# Patient Record
Sex: Male | Born: 1960 | Race: White | Hispanic: No | Marital: Single | Smoking: Former smoker
Health system: Southern US, Community
[De-identification: ages and names within clinical notes are randomized; demographics above are authoritative.]

## PROBLEM LIST (undated history)

## (undated) ENCOUNTER — Emergency Department (HOSPITAL_COMMUNITY): Payer: Self-pay

## (undated) DIAGNOSIS — K219 Gastro-esophageal reflux disease without esophagitis: Secondary | ICD-10-CM

## (undated) DIAGNOSIS — I2510 Atherosclerotic heart disease of native coronary artery without angina pectoris: Secondary | ICD-10-CM

## (undated) HISTORY — PX: CORONARY ANGIOPLASTY WITH STENT PLACEMENT: SHX49

## (undated) HISTORY — PX: HERNIA REPAIR: SHX51

## (undated) HISTORY — PX: ANEURYSM COILING: SHX5349

---

## 2005-12-18 ENCOUNTER — Emergency Department (HOSPITAL_COMMUNITY): Admission: EM | Admit: 2005-12-18 | Discharge: 2005-12-19 | Payer: Self-pay | Admitting: Emergency Medicine

## 2006-03-20 ENCOUNTER — Emergency Department (HOSPITAL_COMMUNITY): Admission: EM | Admit: 2006-03-20 | Discharge: 2006-03-20 | Payer: Self-pay | Admitting: Emergency Medicine

## 2010-06-27 ENCOUNTER — Other Ambulatory Visit: Payer: Self-pay | Admitting: Family Medicine

## 2010-06-27 ENCOUNTER — Ambulatory Visit
Admission: RE | Admit: 2010-06-27 | Discharge: 2010-06-27 | Disposition: A | Discharge status: Home / Self Care | Payer: Commercial Indemnity | Source: Ambulatory Visit | Attending: Family Medicine | Admitting: Family Medicine

## 2010-06-27 DIAGNOSIS — R52 Pain, unspecified: Secondary | ICD-10-CM

## 2010-07-06 ENCOUNTER — Ambulatory Visit: Payer: Commercial Indemnity | Attending: Family Medicine

## 2010-07-06 DIAGNOSIS — M6281 Muscle weakness (generalized): Secondary | ICD-10-CM | POA: Insufficient documentation

## 2010-07-06 DIAGNOSIS — M25569 Pain in unspecified knee: Secondary | ICD-10-CM | POA: Insufficient documentation

## 2010-07-06 DIAGNOSIS — M25669 Stiffness of unspecified knee, not elsewhere classified: Secondary | ICD-10-CM | POA: Insufficient documentation

## 2010-07-06 DIAGNOSIS — IMO0001 Reserved for inherently not codable concepts without codable children: Secondary | ICD-10-CM | POA: Insufficient documentation

## 2010-07-06 DIAGNOSIS — R5381 Other malaise: Secondary | ICD-10-CM | POA: Insufficient documentation

## 2010-07-18 ENCOUNTER — Encounter: Payer: Commercial Indemnity | Admitting: Physical Therapy

## 2011-11-21 ENCOUNTER — Emergency Department (HOSPITAL_COMMUNITY): Payer: BC Managed Care – PPO

## 2011-11-21 ENCOUNTER — Encounter (HOSPITAL_COMMUNITY): Payer: Self-pay | Admitting: Emergency Medicine

## 2011-11-21 ENCOUNTER — Other Ambulatory Visit: Payer: Self-pay

## 2011-11-21 ENCOUNTER — Inpatient Hospital Stay (HOSPITAL_COMMUNITY)
Admission: RE | Admit: 2011-11-21 | Discharge: 2011-11-23 | DRG: 854 | Disposition: A | Discharge status: Home / Self Care | Payer: BC Managed Care – PPO | Attending: Cardiology | Admitting: Cardiology

## 2011-11-21 DIAGNOSIS — I200 Unstable angina: Secondary | ICD-10-CM

## 2011-11-21 DIAGNOSIS — I2510 Atherosclerotic heart disease of native coronary artery without angina pectoris: Principal | ICD-10-CM | POA: Diagnosis present

## 2011-11-21 DIAGNOSIS — Z955 Presence of coronary angioplasty implant and graft: Secondary | ICD-10-CM

## 2011-11-21 DIAGNOSIS — E785 Hyperlipidemia, unspecified: Secondary | ICD-10-CM | POA: Diagnosis present

## 2011-11-21 HISTORY — DX: Gastro-esophageal reflux disease without esophagitis: K21.9

## 2011-11-21 LAB — POCT I-STAT TROPONIN I
Troponin i, poc: 0.01 ng/mL (ref 0.00–0.08)
Troponin i, poc: 0.02 ng/mL (ref 0.00–0.08)

## 2011-11-21 LAB — CBC WITH DIFFERENTIAL/PLATELET
Lymphocytes Relative: 28 % (ref 12–46)
Lymphs Abs: 2.3 10*3/uL (ref 0.7–4.0)
MCV: 85.2 fL (ref 78.0–100.0)
Neutrophils Relative %: 60 % (ref 43–77)
Platelets: 258 10*3/uL (ref 150–400)
RBC: 5.27 MIL/uL (ref 4.22–5.81)
WBC: 8.3 10*3/uL (ref 4.0–10.5)

## 2011-11-21 LAB — COMPREHENSIVE METABOLIC PANEL
ALT: 36 U/L (ref 0–53)
Alkaline Phosphatase: 60 U/L (ref 39–117)
CO2: 27 mEq/L (ref 19–32)
GFR calc Af Amer: >90 mL/min (ref >90)
GFR calc non Af Amer: >90 mL/min (ref >90)
Glucose, Bld: 97 mg/dL (ref 70–99)
Potassium: 4.3 mEq/L (ref 3.5–5.1)
Sodium: 135 mEq/L (ref 135–145)

## 2011-11-21 NOTE — ED Provider Notes (Signed)
5:26 PM Patient to be moved to CDU for chest pain protocol.  This is a shared visit with Dr Judd Lien.  Patient reported to have exertional chest pain and shortness of breath.  BMI 32, plan is for coronary CT.   6:59 PM Patient is in CDU.  No complaints or needs at this time.  Pt has had exertional chest pain for the past 10 days.  Chest pain free currently.  Discussed plan with patient and his wife.  Patient is A&Ox4, NAD, RRR, no m/r/g, lungs CTAB, abd soft, NT, extremities without edema, distal pulses intact.  Plan is for coronary CT tomorrow morning and continued monitoring overnight.   11:20 PM Patient reports he continues to feel well, resting comfortably.  Patient to remain on chest pain protocol overnight.  Coronary CT in the morning.  Patient was initially seen by Dr Judd Lien on Pod A.    11:51 PM Pt signed out to Dr Norlene Campbell who will assume care of patient overnight.    Results for orders placed during the hospital encounter of 11/21/11  CBC WITH DIFFERENTIAL      Component Value Range   WBC 8.3  4.0 - 10.5 K/uL   RBC 5.27  4.22 - 5.81 MIL/uL   Hemoglobin 16.1  13.0 - 17.0 g/dL   HCT 21.3  08.6 - 57.8 %   MCV 85.2  78.0 - 100.0 fL   MCH 30.6  26.0 - 34.0 pg   MCHC 35.9  30.0 - 36.0 g/dL   RDW 46.9  62.9 - 52.8 %   Platelets 258  150 - 400 K/uL   Neutrophils Relative 60  43 - 77 %   Neutro Abs 4.9  1.7 - 7.7 K/uL   Lymphocytes Relative 28  12 - 46 %   Lymphs Abs 2.3  0.7 - 4.0 K/uL   Monocytes Relative 8  3 - 12 %   Monocytes Absolute 0.7  0.1 - 1.0 K/uL   Eosinophils Relative 4  0 - 5 %   Eosinophils Absolute 0.3  0.0 - 0.7 K/uL   Basophils Relative 1  0 - 1 %   Basophils Absolute 0.1  0.0 - 0.1 K/uL  COMPREHENSIVE METABOLIC PANEL      Component Value Range   Sodium 135  135 - 145 mEq/L   Potassium 4.3  3.5 - 5.1 mEq/L   Chloride 99  96 - 112 mEq/L   CO2 27  19 - 32 mEq/L   Glucose, Bld 97  70 - 99 mg/dL   BUN 11  6 - 23 mg/dL   Creatinine, Ser 4.13  0.50 - 1.35 mg/dL   Calcium  9.4  8.4 - 24.4 mg/dL   Total Protein 7.8  6.0 - 8.3 g/dL   Albumin 4.1  3.5 - 5.2 g/dL   AST 25  0 - 37 U/L   ALT 36  0 - 53 U/L   Alkaline Phosphatase 60  39 - 117 U/L   Total Bilirubin 0.7  0.3 - 1.2 mg/dL   GFR calc non Af Amer >90  >90 mL/min   GFR calc Af Amer >90  >90 mL/min  POCT I-STAT TROPONIN I      Component Value Range   Troponin i, poc 0.00  0.00 - 0.08 ng/mL   Comment 3           POCT I-STAT TROPONIN I      Component Value Range   Troponin i, poc 0.01  0.00 -  0.08 ng/mL   Comment 3           POCT I-STAT TROPONIN I      Component Value Range   Troponin i, poc 0.01  0.00 - 0.08 ng/mL   Comment 3            Dg Chest 2 View  11/21/2011  *RADIOLOGY REPORT*  Clinical Data: Chest pain and shortness of breath.  CHEST - 2 VIEW  Comparison: None  Findings: The cardiac silhouette, mediastinal and hilar contours are normal.  The lungs are clear.  No pleural effusion.  The bony thorax is intact.  IMPRESSION: Normal chest x-ray.   Original Report Authenticated By: P. Loralie Champagne, M.D.       [place], [place] 11/21/11 2351

## 2011-11-21 NOTE — ED Notes (Signed)
Cp x 8-9 days ago  No n/v some sob left arm and neck pain also he states

## 2011-11-21 NOTE — ED Provider Notes (Signed)
History     CSN: [id number]  Arrival date & time 11/21/11  1323   First MD Initiated Contact with Patient 11/21/11 1628      Chief Complaint  Patient presents with  . Chest Pain    (Consider location/radiation/quality/duration/timing/severity/associated sxs/prior treatment) Patient is a 51 y.o. male presenting with chest pain. The history is provided by the patient.  Chest Pain Episode onset: 8-9 days ago. Chest pain occurs intermittently. The chest pain is worsening. The pain is associated with exertion. At its most intense, the pain is at 7/10. The pain is currently at 0/10. The severity of the pain is moderate. The quality of the pain is described as tightness. The pain does not radiate. Chest pain is worsened by exertion. Primary symptoms include shortness of breath and nausea. Pertinent negatives for primary symptoms include no fever and no abdominal pain. He tried nothing for the symptoms. Risk factors include no known risk factors.  Pertinent negatives for family medical history include: no CAD in family.  Procedure history is negative for cardiac catheterization, echocardiogram, stress echo and exercise treadmill test.     Past Medical History  Diagnosis Date  . Acid reflux     Past Surgical History  Procedure Date  . Hernia repair     No family history on file.  History  Substance Use Topics  . Smoking status: Never Smoker   . Smokeless tobacco: Not on file  . Alcohol Use: No      Review of Systems  Constitutional: Negative for fever.  Respiratory: Positive for shortness of breath.   Cardiovascular: Positive for chest pain.  Gastrointestinal: Positive for nausea. Negative for abdominal pain.  All other systems reviewed and are negative.    Allergies  Amoxapine and related  Home Medications   Current Outpatient Rx  Name Route Sig Dispense Refill  . ASPIRIN EC 81 MG PO TBEC Oral Take 81 mg by mouth daily.    Marland Kitchen ACID REDUCER PO Oral Take 1 tablet by  mouth daily as needed. For acid reflux      BP 165/93  Pulse 56  Temp 97.9 F (36.6 C)  Resp 16  SpO2 98%  Physical Exam  Nursing note and vitals reviewed. Constitutional: He is oriented to person, place, and time. He appears well-developed and well-nourished. No distress.  HENT:  Head: Normocephalic and atraumatic.  Mouth/Throat: Oropharynx is clear and moist.  Neck: Normal range of motion. Neck supple.  Cardiovascular: Normal rate and regular rhythm.   No murmur heard. Pulmonary/Chest: Effort normal and breath sounds normal. No respiratory distress. He has no wheezes.  Abdominal: Soft. Bowel sounds are normal. He exhibits no distension. There is no tenderness.  Musculoskeletal: Normal range of motion. He exhibits no edema.  Neurological: He is alert and oriented to person, place, and time.  Skin: Skin is warm and dry. He is not diaphoretic.    ED Course  Procedures (including critical care time)   Labs Reviewed  CBC WITH DIFFERENTIAL  COMPREHENSIVE METABOLIC PANEL  POCT I-STAT TROPONIN I   Dg Chest 2 View  11/21/2011  *RADIOLOGY REPORT*  Clinical Data: Chest pain and shortness of breath.  CHEST - 2 VIEW  Comparison: None  Findings: The cardiac silhouette, mediastinal and hilar contours are normal.  The lungs are clear.  No pleural effusion.  The bony thorax is intact.  IMPRESSION: Normal chest x-ray.   Original Report Authenticated By: P. Loralie Champagne, M.D.      No diagnosis found.  Date: 11/21/2011  Rate: 54  Rhythm: sinus bradycardia  QRS Axis: normal  Intervals: normal  ST/T Wave abnormalities: normal  Conduction Disutrbances:none  Narrative Interpretation:   Old EKG Reviewed: none available    MDM  The patient presents with exertional chest pain and shortness of breath, but negative troponin and ekg.  He will be placed in the CDU overnight and undergo stress testing in the am.          Geoffery Lyons, MD 11/21/11 1654

## 2011-11-21 NOTE — ED Notes (Signed)
Just came from dr office eagle they gave  Him [facility]

## 2011-11-22 ENCOUNTER — Observation Stay (HOSPITAL_COMMUNITY): Payer: BC Managed Care – PPO

## 2011-11-22 ENCOUNTER — Encounter (HOSPITAL_COMMUNITY): Payer: Self-pay | Admitting: Radiology

## 2011-11-22 ENCOUNTER — Encounter (HOSPITAL_COMMUNITY)
Admission: RE | Disposition: A | Discharge status: Home / Self Care | Payer: Self-pay | Source: Home / Self Care | Attending: Cardiology

## 2011-11-22 DIAGNOSIS — I200 Unstable angina: Secondary | ICD-10-CM

## 2011-11-22 HISTORY — PX: LEFT HEART CATHETERIZATION WITH CORONARY ANGIOGRAM: SHX5451

## 2011-11-22 HISTORY — DX: Unstable angina: I20.0

## 2011-11-22 LAB — LIPID PANEL
HDL: 33 mg/dL — ABNORMAL LOW (ref >39)
LDL Cholesterol: 158 mg/dL — ABNORMAL HIGH (ref 0–99)
Total CHOL/HDL Ratio: 6.8 RATIO
Triglycerides: 173 mg/dL — ABNORMAL HIGH (ref <150)
VLDL: 35 mg/dL (ref 0–40)

## 2011-11-22 LAB — CK TOTAL AND CKMB (NOT AT ARMC): Total CK: 125 U/L (ref 7–232)

## 2011-11-22 LAB — TROPONIN I: Troponin I: <0.3 ng/mL (ref <0.30)

## 2011-11-22 SURGERY — LEFT HEART CATHETERIZATION WITH CORONARY ANGIOGRAM
Anesthesia: LOCAL

## 2011-11-22 MED ORDER — SODIUM CHLORIDE 0.9 % IV SOLN: INTRAVENOUS | Status: DC

## 2011-11-22 MED ORDER — NITROGLYCERIN 0.4 MG SL SUBL: 0.4000 mg | SUBLINGUAL_TABLET | SUBLINGUAL | Status: DC | PRN

## 2011-11-22 MED ORDER — ASPIRIN EC 81 MG PO TBEC: 81.0000 mg | DELAYED_RELEASE_TABLET | Freq: Every day | ORAL | Status: DC

## 2011-11-22 MED ORDER — ACETAMINOPHEN 325 MG PO TABS
650.0000 mg | ORAL_TABLET | ORAL | Status: DC | PRN
  Administered 2011-11-22 – 2011-11-23 (×2): 650 mg via ORAL
  Filled 2011-11-22 (×2): qty 2

## 2011-11-22 MED ORDER — SODIUM CHLORIDE 0.9 % IJ SOLN: 3.0000 mL | INTRAMUSCULAR | Status: DC | PRN

## 2011-11-22 MED ORDER — MIDAZOLAM HCL 2 MG/2ML IJ SOLN
INTRAMUSCULAR | Status: AC
  Filled 2011-11-22: qty 2

## 2011-11-22 MED ORDER — ASPIRIN 81 MG PO CHEW
81.0000 mg | CHEWABLE_TABLET | Freq: Every day | ORAL | Status: DC
Start: 2011-11-22 — End: 2011-11-22

## 2011-11-22 MED ORDER — FAMOTIDINE IN NACL 20-0.9 MG/50ML-% IV SOLN
INTRAVENOUS | Status: AC
  Filled 2011-11-22: qty 50

## 2011-11-22 MED ORDER — LIDOCAINE HCL (PF) 1 % IJ SOLN
INTRAMUSCULAR | Status: AC
  Filled 2011-11-22: qty 30

## 2011-11-22 MED ORDER — VERAPAMIL HCL 2.5 MG/ML IV SOLN
INTRAVENOUS | Status: AC
  Filled 2011-11-22: qty 2

## 2011-11-22 MED ORDER — ASPIRIN 300 MG RE SUPP
300.0000 mg | RECTAL | Status: DC
Start: 2011-11-22 — End: 2011-11-22

## 2011-11-22 MED ORDER — SODIUM CHLORIDE 0.9 % IV SOLN
1.0000 mL/kg/h | INTRAVENOUS | Status: AC
  Administered 2011-11-22: 1 mL/kg/h via INTRAVENOUS

## 2011-11-22 MED ORDER — CARVEDILOL 3.125 MG PO TABS
3.1250 mg | ORAL_TABLET | Freq: Two times a day (BID) | ORAL | Status: DC
  Administered 2011-11-23: 3.125 mg via ORAL
  Filled 2011-11-22 (×2): qty 1

## 2011-11-22 MED ORDER — ACETAMINOPHEN 325 MG PO TABS: 650.0000 mg | ORAL_TABLET | ORAL | Status: DC | PRN

## 2011-11-22 MED ORDER — ASPIRIN 81 MG PO CHEW: 324.0000 mg | CHEWABLE_TABLET | ORAL | Status: DC

## 2011-11-22 MED ORDER — CIMETIDINE 200 MG PO TABS: 300.0000 mg | ORAL_TABLET | Freq: Two times a day (BID) | ORAL | Status: DC

## 2011-11-22 MED ORDER — ONDANSETRON HCL 4 MG/2ML IJ SOLN: 4.0000 mg | Freq: Four times a day (QID) | INTRAMUSCULAR | Status: DC | PRN

## 2011-11-22 MED ORDER — NITROGLYCERIN 0.4 MG SL SUBL
SUBLINGUAL_TABLET | SUBLINGUAL | Status: AC
  Administered 2011-11-22: 0.4 mg via SUBLINGUAL
  Filled 2011-11-22: qty 25

## 2011-11-22 MED ORDER — TICAGRELOR 90 MG PO TABS
ORAL_TABLET | ORAL | Status: AC
  Filled 2011-11-22: qty 2

## 2011-11-22 MED ORDER — NITROGLYCERIN 0.4 MG SL SUBL
0.4000 mg | SUBLINGUAL_TABLET | Freq: Once | SUBLINGUAL | Status: AC
  Administered 2011-11-22: 0.4 mg via SUBLINGUAL

## 2011-11-22 MED ORDER — ATORVASTATIN CALCIUM 80 MG PO TABS
80.0000 mg | ORAL_TABLET | Freq: Every day | ORAL | Status: DC
  Filled 2011-11-22 (×2): qty 1

## 2011-11-22 MED ORDER — ACETAMINOPHEN 325 MG PO TABS
975.0000 mg | ORAL_TABLET | Freq: Once | ORAL | Status: AC
  Administered 2011-11-22: 975 mg via ORAL
  Filled 2011-11-22: qty 3

## 2011-11-22 MED ORDER — DIAZEPAM 5 MG PO TABS
10.0000 mg | ORAL_TABLET | Freq: Once | ORAL | Status: DC
  Filled 2011-11-22: qty 2

## 2011-11-22 MED ORDER — ATORVASTATIN CALCIUM 40 MG PO TABS
40.0000 mg | ORAL_TABLET | Freq: Every day | ORAL | Status: DC
  Administered 2011-11-22: 40 mg via ORAL
  Filled 2011-11-22 (×2): qty 1

## 2011-11-22 MED ORDER — NITROGLYCERIN 0.2 MG/ML ON CALL CATH LAB
INTRAVENOUS | Status: AC
  Filled 2011-11-22: qty 1

## 2011-11-22 MED ORDER — SODIUM CHLORIDE 0.9 % IJ SOLN: 3.0000 mL | Freq: Two times a day (BID) | INTRAMUSCULAR | Status: DC

## 2011-11-22 MED ORDER — METOPROLOL TARTRATE 1 MG/ML IV SOLN
INTRAVENOUS | Status: AC
  Filled 2011-11-22: qty 15

## 2011-11-22 MED ORDER — METOPROLOL TARTRATE 25 MG PO TABS
50.0000 mg | ORAL_TABLET | Freq: Once | ORAL | Status: AC
  Administered 2011-11-22: 50 mg via ORAL
  Filled 2011-11-22: qty 2

## 2011-11-22 MED ORDER — HEPARIN (PORCINE) IN NACL 2-0.9 UNIT/ML-% IJ SOLN
INTRAMUSCULAR | Status: AC
  Filled 2011-11-22: qty 1000

## 2011-11-22 MED ORDER — GI COCKTAIL ~~LOC~~
30.0000 mL | Freq: Three times a day (TID) | ORAL | Status: DC | PRN
  Filled 2011-11-22: qty 30

## 2011-11-22 MED ORDER — SODIUM CHLORIDE 0.9 % IV SOLN: 250.0000 mL | INTRAVENOUS | Status: DC | PRN

## 2011-11-22 MED ORDER — PANTOPRAZOLE SODIUM 40 MG PO TBEC
80.0000 mg | DELAYED_RELEASE_TABLET | Freq: Every day | ORAL | Status: DC
  Administered 2011-11-22: 22:00:00 80 mg via ORAL
  Filled 2011-11-22 (×2): qty 2

## 2011-11-22 MED ORDER — CARVEDILOL 3.125 MG PO TABS: 3.1250 mg | ORAL_TABLET | Freq: Two times a day (BID) | ORAL | Status: DC

## 2011-11-22 MED ORDER — ASPIRIN 81 MG PO CHEW
324.0000 mg | CHEWABLE_TABLET | Freq: Once | ORAL | Status: AC
  Administered 2011-11-22: 324 mg via ORAL
  Filled 2011-11-22: qty 4

## 2011-11-22 MED ORDER — BIVALIRUDIN 250 MG IV SOLR
INTRAVENOUS | Status: AC
  Filled 2011-11-22: qty 250

## 2011-11-22 MED ORDER — FAMOTIDINE 20 MG PO TABS
20.0000 mg | ORAL_TABLET | Freq: Two times a day (BID) | ORAL | Status: DC
  Administered 2011-11-22: 20 mg via ORAL
  Filled 2011-11-22 (×3): qty 1

## 2011-11-22 MED ORDER — TICAGRELOR 90 MG PO TABS
90.0000 mg | ORAL_TABLET | Freq: Two times a day (BID) | ORAL | Status: DC
  Administered 2011-11-23: 90 mg via ORAL
  Filled 2011-11-22 (×3): qty 1

## 2011-11-22 MED ORDER — IOHEXOL 350 MG/ML SOLN
80.0000 mL | Freq: Once | INTRAVENOUS | Status: AC | PRN
  Administered 2011-11-22: 80 mL via INTRAVENOUS

## 2011-11-22 NOTE — Progress Notes (Signed)
TR BAND REMOVAL  LOCATION:    right radial  DEFLATED PER PROTOCOL:    yes  TIME BAND OFF / DRESSING APPLIED:    22:45   SITE UPON ARRIVAL:    Level 0  SITE AFTER BAND REMOVAL:    Level 0  REVERSE ALLEN'S TEST:     positive  CIRCULATION SENSATION AND MOVEMENT:    Within Normal Limits   yes  COMMENTS:    

## 2011-11-22 NOTE — ED Notes (Signed)
CONSENT SIGNED, AWAITING CALL FROM CATH LAB.

## 2011-11-22 NOTE — H&P (Signed)
Adam Calderon is an 51 y.o. male.   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 She describes this as pressure like sensation in the middle of the chest without any radiation. Chest pain is described as pressure-like sensation in the middle of the chest and mostly comes with exertional activity. Each episode lasted a few minutes, however she has had at least about 2-3 episodes a day. Today his chest discomfort was much more severe and lasted for about 45 minutes. It Coming back. Due to this he presented to his PCP office, he was advised to the emergency department. In the ED he did rule out for myocardial infarction, however he still continued to have chest discomfort. A CT angiogram of the chest revealed a significant coronary calcification and also soft plaque involving the proximal LAD and right coronary artery. Patient states that he's had at least about 3 episodes of chest discomfort while in the emergency room. They lasted for about 5 minutes and spontaneously subsided. He otherwise feels comfortable. No other associated symptomatology including he denies any dyspnea, palpitations, dizziness or syncope. Family members at the bedside including his wife.  Past Medical History  Diagnosis Date  . Acid reflux     Past Surgical History  Procedure Date  . Hernia repair     No family history on file.  Paternal grandfather with MI and died at age 49 years. Father died young with drug overdose.  Social History:  reports that he has never smoked. He does not have any smokeless tobacco history on file. He reports that he does not drink alcohol. His drug history not on file.  Allergies:  Allergies  Allergen Reactions  . Amoxapine And Related Itching  and Rash     (Not in a hospital admission)  Results for orders placed during the hospital encounter of 11/21/11 (from the past 48 hour(s))  CBC WITH DIFFERENTIAL     Status: Normal   Collection Time   11/21/11  1:24 PM      Component Value Range Comment   WBC 8.3  4.0 - 10.5 K/uL    RBC 5.27  4.22 - 5.81 MIL/uL    Hemoglobin 16.1  13.0 - 17.0 g/dL    HCT 21.3  08.6 - 57.8 %    MCV 85.2  78.0 - 100.0 fL    MCH 30.6  26.0 - 34.0 pg    MCHC 35.9  30.0 - 36.0 g/dL    RDW 46.9  62.9 - 52.8 %    Platelets 258  150 - 400 K/uL    Neutrophils Relative 60  43 - 77 %    Neutro Abs 4.9  1.7 - 7.7 K/uL    Lymphocytes Relative 28  12 - 46 %    Lymphs Abs 2.3  0.7 - 4.0 K/uL    Monocytes Relative 8  3 - 12 %    Monocytes Absolute 0.7  0.1 - 1.0 K/uL    Eosinophils Relative 4  0 - 5 %    Eosinophils Absolute 0.3  0.0 - 0.7 K/uL    Basophils Relative 1  0 - 1 %    Basophils Absolute 0.1  0.0 - 0.1 K/uL   COMPREHENSIVE METABOLIC PANEL  Status: Normal   Collection Time   11/21/11  1:24 PM      Component Value Range Comment   Sodium 135  135 - 145 mEq/L    Potassium 4.3  3.5 - 5.1 mEq/L    Chloride 99  96 - 112 mEq/L    CO2 27  19 - 32 mEq/L    Glucose, Bld 97  70 - 99 mg/dL    BUN 11  6 - 23 mg/dL    Creatinine, Ser 1.61  0.50 - 1.35 mg/dL    Calcium 9.4  8.4 - 09.6 mg/dL    Total Protein 7.8  6.0 - 8.3 g/dL    Albumin 4.1  3.5 - 5.2 g/dL    AST 25  0 - 37 U/L    ALT 36  0 - 53 U/L    Alkaline Phosphatase 60  39 - 117 U/L    Total Bilirubin 0.7  0.3 - 1.2 mg/dL    GFR calc non Af Amer >90  >90 mL/min    GFR calc Af Amer >90  >90 mL/min   POCT I-STAT TROPONIN I     Status: Normal   Collection Time   11/21/11  1:38 PM      Component Value Range Comment   Troponin i, poc 0.00  0.00 - 0.08 ng/mL    Comment 3            POCT I-STAT TROPONIN I     Status: Normal   Collection Time   11/21/11  6:13 PM      Component Value Range Comment   Troponin i, poc 0.01  0.00 - 0.08 ng/mL     Comment 3            POCT I-STAT TROPONIN I     Status: Normal   Collection Time   11/21/11  8:54 PM      Component Value Range Comment   Troponin i, poc 0.01  0.00 - 0.08 ng/mL    Comment 3            POCT I-STAT TROPONIN I     Status: Normal   Collection Time   11/21/11 11:38 PM      Component Value Range Comment   Troponin i, poc 0.02  0.00 - 0.08 ng/mL    Comment 3            POCT I-STAT TROPONIN I     Status: Normal   Collection Time   11/22/11  2:27 PM      Component Value Range Comment   Troponin i, poc 0.00  0.00 - 0.08 ng/mL    Comment 3             CT angiogram of the chest/Cardiac CT:  1.  Positive for obstructive coronary artery disease.  The patient's total coronary artery calcium score is 250, which is 97 percentile for patient's matched age and gender. 2.  Greater than 75% stenosis within the mid LAD and proximal to mid RCA related to a large amount of low attenuation plaque with positive remodeling. 3.  Right coronary artery dominance.   Labs:   Lab Results  Component Value Date   WBC 8.3 11/21/2011   HGB 16.1 11/21/2011   HCT 44.9 11/21/2011   MCV 85.2 11/21/2011   PLT 258 11/21/2011    Lab 11/21/11 1324  NA 135  K 4.3  CL 99  CO2 27  BUN 11  CREATININE 0.87  CALCIUM 9.4  PROT 7.8  BILITOT 0.7  ALKPHOS 60  ALT 36  AST 25  GLUCOSE 97   No results found for this basename: CKTOTAL, CKMB, CKMBINDEX, TROPONINI    Lipid Panel   Review of Systems - he denies any bowel or bladder disturbances, denies any neurological weaknesses, is not a diabetic, denies any symptoms to suggest TIA or claudication. No syncope. No hemoptysis. Other systems negative.  Blood pressure 141/88, pulse 48, temperature 98.1 F (36.7 C), temperature source Oral, resp. rate 21, height 5\' 10"  (1.778 m), weight 101.152 kg (223 lb), SpO2 97.00%. General appearance: alert, cooperative, appears older than stated age and mildly obese Eyes: conjunctivae/corneas clear. PERRL, EOM's  intact. Fundi benign. Neck: no adenopathy, no carotid bruit, no JVD, supple, symmetrical, trachea midline and thyroid not enlarged, symmetric, no tenderness/mass/nodules Neck: JVP - normal, carotids 2+= without bruits Resp: clear to auscultation bilaterally Chest wall: no tenderness Cardio: regular rate and rhythm, S1, S2 normal, no murmur, click, rub or gallop GI: soft, non-tender; bowel sounds normal; no masses,  no organomegaly Extremities: extremities normal, atraumatic, no cyanosis or edema Pulses: 2+ and symmetric Skin: Skin color, texture, turgor normal. No rashes or lesions Neurologic: Alert and oriented X 3, normal strength and tone. Normal symmetric reflexes. Normal coordination and gait  EKG: Normal sinus rhythm, normal intervals, no evidence of ischemia. CT scan, CT angiogram of the chest results were reviewed.  Assessment/Plan 1. Chest pain suggestive of unstable angina pectoris. Patient continues to have chest discomfort which he spontaneously relieved while in the emergency room. Presently appears comfortable and is physical examination is unremarkable. CT angiogram findings are pretty remarkable and suggestive of Mid LAD and Proximal right coronary artery stenosis.  Recommendation: Patient's prior history of tobacco use, abnormal CT angiogram with ongoing chest discomfort places him at a high risk for coronary event. His symptoms are very typical for unstable angina pectoris. I will admit the patient and proceed with cardiac catheterization this afternoon. I have explained to the patient regarding the risks, benefits, alternatives to cardiac catheterization. Medical therapy was also discussed with the patient. She understands to less than 1% risk of death stroke, MI, and need for urgent CABG, bleeding, infection, renal failure but not limited to these. Patient is willing.  Pamella Pert, MD 11/22/2011, 4:14 PM

## 2011-11-22 NOTE — ED Notes (Signed)
PT AND FAMILY UPDATED ON APPROX TIME TO CATH LAB

## 2011-11-22 NOTE — CV Procedure (Signed)
Procedure performed:  Left heart catheterization including hemodynamic monitoring of the left ventricle, LV gram. Selective right and left coronary arteriography. PTCA and stenting  of the Mid LAD with 3.5 x 23 mm Xience xpedition stent and proximal RCA with implantation of a 4.0 x 23 mm Xience epedition stent. The stenosis in both vessels was reduced from 95% to 0%.  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 Patient has  had CT angiogram of the heart as a non invasive testing which was abnormal.  Hence is brought to the cardiac catheterization lab to evaluate  coronary anatomy for definitive diagnosis of CAD.  Hemodynamic data: Left ventricular pressure was 114/1 with LVEDP of 8 mm mercury. Aortic pressure was 116/76 with a mean of 94 mm mercury. There was no pressure gradient across the aortic valve.   Left ventricle: Performed in the RAO projection revealed LVEF of  60%. There was no significant MR. No wall motion abnormality.  Right coronary artery: The vessel is smooth, Large and Dominant. Proximal segment showed a 95% stenosis.  Left main coronary artery is large and normal.  Circumflex coronary artery: A large vessel giving origin to a small obtuse marginal 1. Mid segment shows a 50-60% mid stenosis and a small obtuse marginal 1 shows a 70% stenosis.   LAD:  LAD gives origin to small to moderate diagonals.Marland Kitchen  LAD has high-grade made 95% stenosis.   Impression: Severe two-vessel coronary artery disease with high-grade mid LAD and proximal to mid RCA stenosis. Moderate stenosis in the circumflex coronary artery.  Interventional data: Successful  PTCA and stenting  of the Mid LAD with 3.5 x 23 mm Xience xpedition stent and proximal RCA with implantation of a 4.0 x 23 mm Xience epedition stent.  Will need Dual  antiplatelet therapy with Brilinta and ASA 81 mg for at least 1 year.   Technique of diagnostic cardiac catheterization:  Under sterile precautions using a 6 French right radial  arterial access, a 6 French sheath was introduced into the right radial artery. A 5 [place] Tig 4 catheter was advanced into the ascending aorta selective  right coronary artery and left coronary artery was cannulated and angiography was performed in multiple views. The catheter was pulled back Out of the body over exchange length J-wire.  XB 3.5 guide Catheter was used to perform LV gram which was performed in RAO projection. Catheter exchanged out of the body over J-Wire.   Technique of intervention:  Using a 6 [place] XB 3.5 guide catheter the LM  coronary  was selected and cannulated. Using Angiomax for anticoagulation, I utilized a Couger guidewire and across the LAD coronary artery without any difficulty. I placed the tip of the wire into the distal  coronary artery. Angiography was performed.   Then I utilized a emerge 2.5 x 15 mm balloon , I performed balloon angioplasty at 12 pressure x 2 for 40 seconds each.   I proceeded with implantation of a 3.5x23 mm Xience  drug-eluting stent into the mid LAD coronary artery. The stent was deployed at 13 atmospheric pressure for 40  seconds. The stent was then post dilated with a 3.5x15 mm Alanson Trek balloon at 16 atm pressure for 40 sec. Post-balloon angioplasty results were excellent with 0% residual stenoses and TIMI-3 flow was maintained. There was no evidence  of edge dissection. The guidewire was withdrawn out of the body and the guide catheter was engaged and pulled out of the body over the J-wire the was no immediate complication.   Attention was directed towards the right coronary artery. A 6 [place] JR 4 guide cath was utilized to engage the right coronary artery and using the same guidewire, advanced the same emerge 2.5 x 15 mm balloon at the site of the stenosis and  angioplasty was performed at 16 atmospheric pressure for 40 seconds. This was followed by implantation of a 4.0 x 23 mm Xience drug-eluting stent at the site which was deployed at 16 atmospheric pressure followed by post dilatation with the same size balloon i.e. 4.0 x 15 mm Elberfeld trek at 16 atmospheric pressure for 40 seconds each. In the cranial views the amount of acentric plaque was evident at the site of highest stenosis and I did perform proximal balloon angioplasty to smooth out the stent, however due to significant plaque burden I could see although the stent was well opposed there was a step up and step down but without any edge dissection, thrombus, dye hangup. Hence the procedure was felt to be satisfactory. The guidewire was disengaged and pulled out of the body over J-wire.  Patient tolerated the procedure well. Hemostasis was obtained by applying TR band.  Disposition: Patient will be discharged in AM unless complications with out-patient follow up.

## 2011-11-22 NOTE — ED Provider Notes (Signed)
Medical screening examination/treatment/procedure(s) were performed by non-physician practitioner and as supervising physician I was immediately available for consultation/collaboration.  Ava Tangney, MD 11/22/11 0128 

## 2011-11-22 NOTE — ED Notes (Signed)
CARDIOLOGY HERE TO SEE PT 

## 2011-11-22 NOTE — Interval H&P Note (Signed)
History and Physical Interval Note:  11/22/2011 5:46 PM  Adam Calderon  has presented today for surgery, with the diagnosis of cp  The various methods of treatment have been discussed with the patient and family. After consideration of risks, benefits and other options for treatment, the patient has consented to  Procedure(s) (LRB) with comments: LEFT HEART CATHETERIZATION WITH CORONARY ANGIOGRAM (N/A) and possibl;e angioplasty as a surgical intervention .  The patient's history has been reviewed, patient examined, no change in status, stable for surgery.  I have reviewed the patient's chart and labs.  Questions were answered to the patient's satisfaction.     Pamella Pert

## 2011-11-22 NOTE — ED Notes (Signed)
PT HAS RETURNED FROM CT. TOLERATED WELL.  

## 2011-11-22 NOTE — ED Notes (Signed)
TO CATH LAB VIA STRETCHER

## 2011-11-22 NOTE — ED Provider Notes (Signed)
Medical screening examination/treatment/procedure(s) were performed by non-physician practitioner and as supervising physician I was immediately available for consultation/collaboration.  Derwood Kaplan, MD 11/22/11 1654

## 2011-11-22 NOTE — ED Provider Notes (Signed)
Adam Calderon is a 51 y.o. male on chest pain protocol pending coronary CT from pod A.   Coronary CT results emergently called in by Dr. Rossie Muskrat: Patient has obstructive three-vessel disease with calcium score in the 97th percentile. There are lipid laden plaque scenes which he feels is at risk for rupture. He recommends cardiology consult be initiated now.  Attending Dr. Rhunette Croft notified.  Patient had full dose aspirin at his primary care doctor at [facility] at 1:30 PM yesterday.  Cardiologist Dr. Kathee Polite she consult it after verbal report called in by Dr. do for her. He recommends holding off on heparin as patient is chest pain-free at this time. He will come to evaluate Adam Calderon shortly.   Saw examined the patient in his room he is resting comfortably with his wife. I explained the coronary CT findings and that intervention will likely be taken today by the cardiologist. Patient is currently chest pain-free. Lung sounds are clear to clear to auscultation, there are no murmurs rubs or gallops the heart rate is a regular rhythm. Abdominal exam is benign.  1:20 PM Pt's wife reports 3x episodes of resolved chest pain. Advised Pt to call nurse ASAP if pain returns. I have re-paged Dr. Jacinto Halim to update him.   EKG is unchanged and recheck troponin is negative.  3:15 PM Dr. Jacinto Halim  she is evaluating the patient. He was taken to the Cath Lab.   Wynetta Emery, PA-C 11/22/11 1515

## 2011-11-22 NOTE — ED Notes (Signed)
BMI=32 WEIGHT 223 LBS / HEIGHT 5'10"

## 2011-11-22 NOTE — ED Notes (Signed)
Pre-op shave completed.

## 2011-11-23 LAB — CBC
MCHC: 35.8 g/dL (ref 30.0–36.0)
Platelets: 235 10*3/uL (ref 150–400)
RDW: 12.6 % (ref 11.5–15.5)
WBC: 7.9 10*3/uL (ref 4.0–10.5)

## 2011-11-23 LAB — BASIC METABOLIC PANEL
Chloride: 103 mEq/L (ref 96–112)
GFR calc Af Amer: >90 mL/min (ref >90)
GFR calc non Af Amer: >90 mL/min (ref >90)
Potassium: 3.3 mEq/L — ABNORMAL LOW (ref 3.5–5.1)
Sodium: 138 mEq/L (ref 135–145)

## 2011-11-23 MED ORDER — TICAGRELOR 90 MG PO TABS: 90.0000 mg | ORAL_TABLET | Freq: Two times a day (BID) | ORAL | Status: DC

## 2011-11-23 MED ORDER — CARVEDILOL 3.125 MG PO TABS: 3.1250 mg | ORAL_TABLET | Freq: Two times a day (BID) | ORAL | Status: DC

## 2011-11-23 MED ORDER — PANTOPRAZOLE SODIUM 40 MG PO TBEC: 40.0000 mg | DELAYED_RELEASE_TABLET | Freq: Every day | ORAL | Status: DC

## 2011-11-23 MED ORDER — ATORVASTATIN CALCIUM 40 MG PO TABS: 40.0000 mg | ORAL_TABLET | Freq: Every day | ORAL | Status: DC

## 2011-11-23 MED FILL — Dextrose Inj 5%: INTRAVENOUS | Qty: 50 | Status: AC

## 2011-11-23 NOTE — Progress Notes (Signed)
CARDIAC REHAB PHASE I   PRE:  Rate/Rhythm: 67SR  BP:  Supine:   Sitting: 150/77  Standing:    SaO2:   MODE:  Ambulation: 700 ft   POST:  Rate/Rhythem: 80SR  BP:  Supine:   Sitting: 132/91  Standing:    SaO2:  0900-1014 Pt walked 900 ft with steady gait. Tolerated well. Denied CP. Education completed with pt and wife. Permission given to refer to [facility] Phase 2. Pt and wife with many questions.  Duanne Limerick

## 2011-11-23 NOTE — Discharge Summary (Signed)
Physician Discharge Summary  Patient ID: Adam Calderon MRN: [id number] DOB/AGE: 1960/02/28 51 y.o.  Admit date: 11/21/2011 Discharge date: 11/23/2011  Primary Discharge Diagnosis unstable angina pectoris 2. Coronary artery disease status post PTCA and stenting of the mid LAD with a 3.5 x 23 mm and proximal RCA with implantation of a 4.0 x 23 mm Xience epedition stent. This was performed on 11/22/2011  Secondary Discharge Diagnosis hyperlipidemia  Significant Diagnostic Studies: cardiac catheterization and angioplasty on 11/22/2011, cardiac CT angiogram revealing significant coronary artery disease.   Consults:   Hospital Course:  patient was admitted via emergency department, symptoms were very typical for unstable angina, was taken on a semiurgent basis to the clinic catheterization lab and underwent successful angioplasty. His cardiac markers were negative and the following day. EKG did not have any evidence of skin. It was felt stable for discharge. He will need dual antiplatelet therapy for at least a period of a year.    Discharge Exam: Blood pressure 150/77, pulse 61, temperature 97.7 F (36.5 C), temperature source Oral, resp. rate 15, height 5\' 10"  (1.778 m), weight 104.1 kg (229 lb 8 oz), SpO2 98.00%.     General appearance: alert, cooperative and appears stated age Resp: clear to auscultation bilaterally Cardio: regular rate and rhythm, S1, S2 normal, no murmur, click, rub or gallop GI: soft, non-tender; bowel sounds normal; no masses,  no organomegaly Extremities: extremities normal, atraumatic, no cyanosis or edema and Right radial arterial access site looks good with good pulse Pulses: 2+ and symmetric Labs:   Lab Results  Component Value Date   WBC 7.9 11/23/2011   HGB 15.2 11/23/2011   HCT 42.5 11/23/2011   MCV 85.2 11/23/2011   PLT 235 11/23/2011    Lab 11/23/11 0215 11/21/11 1324  NA 138 --  K 3.3* --  CL 103 --  CO2 24 --  BUN 13 --  CREATININE 0.96 --   CALCIUM 8.7 --  PROT -- 7.8  BILITOT -- 0.7  ALKPHOS -- 60  ALT -- 36  AST -- 25  GLUCOSE 134* --   Lab Results  Component Value Date   CKTOTAL 125 11/22/2011   CKMB 2.0 11/22/2011   TROPONINI <0.30 11/23/2011    Lab Results  Component Value Date   CHOL 226* 11/22/2011   Lab Results  Component Value Date   HDL 33* 11/22/2011   Lab Results  Component Value Date   LDLCALC 158* 11/22/2011   Lab Results  Component Value Date   TRIG 173* 11/22/2011   Lab Results  Component Value Date   CHOLHDL 6.8 11/22/2011   No results found for this basename: LDLDIRECT      Radiology: Dg Chest 2 View  11/21/2011  *RADIOLOGY REPORT*  Clinical Data: Chest pain and shortness of breath.  CHEST - 2 VIEW  Comparison: None  Findings: The cardiac silhouette, mediastinal and hilar contours are normal.  The lungs are clear.  No pleural effusion.  The bony thorax is intact.  IMPRESSION: Normal chest x-ray.   Original Report Authenticated By: P. Loralie Champagne, M.D.    Ct Heart Morp W/cta Cor W/score W/ca W/cm &/or Wo/cm  11/22/2011  *RADIOLOGY REPORT*  INDICATION:  Chest pain.  CT ANGIOGRAPHY OF THE HEART, CORONARY ARTERY, STRUCTURE, AND MORPHOLOGY  CONTRAST: 80mL OMNIPAQUE IOHEXOL 350 MG/ML SOLN  COMPARISON:  None.  TECHNIQUE:  CT angiography of the coronary vessels was performed on a 256 channel system using prospective ECG gating.  A scout and noncontrast exam (  for calcium scoring) were performed.  Circulation time was measured using a test bolus.  Coronary CTA was performed with sub mm slice collimation during portions of the cardiac cycle after prior injection of iodinated contrast.  Imaging post processing was performed on an independent workstation creating multiplanar and 3-D images, and quantitative analysis of the heart and coronary arteries.  Note that this exam targets the heart and the chest was not imaged in its entirety.  PREMEDICATION: Lopressor 50 mg, P.O. Nitroglycerin 0.4. mcg,  sublingual.  FINDINGS: Technical quality:  Excellent  Heart rate:  44  CORONARY ARTERIES: Left main coronary artery:   Mild amount of noncalcified plaque near the bifurcation of the left main coronary artery without hemodynamically significant stenosis. Left anterior descending:  Extensive mixed calcified and noncalcified plaque within the proximal and mid LAD. Greater than 75% narrowing of the lumen within the mid LAD in the area of noncalcified plaque just beyond the first septal perforator branch. The plaque demonstrates positive remodeling with a large amount of low attenuation (likely lipid laden) plaque. Left circumflex:  Moderate mixed plaque within the proximal to mid left circumflex causing 50-75% stenosis.  Mild amount of positive remodeling.  Mild to moderate plaque in the second obtuse marginal branch proximally, with likely no significant stenosis, less than 50%. Right coronary artery:  Large amount of calcified and noncalcified plaque in the proximal to mid RCA.  There is extensive positive remodeling within the noncalcified plaque with a large amount of low attenuation (likely lipid laden) plaque.  This causes critical stenosis, greater than 75% just beyond the SA nodal branch origin. Calcified nonobstructive plaque within the distal RCA. Posterior descending artery:  Widely patent. Dominance:  Right  CORONARY CALCIUM:  Total Agatston Score:  250.15 MESA database percentile:  97  CARDIAC MEASUREMENTS: Interventricular septum (6 - 12 mm):  10 mm LV posterior wall (6 - 12 mm):  12 mm LV diameter in diastole (35 - 52 mm):  46 mm  AORTA AND PULMONARY MEASUREMENTS: Aortic root (21 - 40 mm):             24 mm  at the annulus             34 mm  at the sinuses of Valsalva             27 mm  at the sinotubular junction Ascending aorta ( <  40 mm):  33 mm Descending aorta ( <  40 mm):  23 mm Main pulmonary artery:  ( <  30 mm):  22 mm  EXTRACARDIAC FINDINGS: No filling defects in the pulmonary arteries to  suggest pulmonary emboli.  Lungs are clear.  No pleural effusions.  No adenopathy in the visualized lower mediastinum or hila.  IMPRESSION:  1.  Positive for obstructive coronary artery disease.  The patient's total coronary artery calcium score is 250, which is 97 percentile for patient's matched age and gender. 2.  Greater than 75% stenosis within the mid LAD and proximal to mid RCA related to a large amount of low attenuation plaque with positive remodeling. 3.  Right coronary artery dominance.  Report was called to [place] at 10:45 a.m. on 11/22/2011.   Original Report Authenticated By: Cyndie Chime, M.D.     EKG: normal sinus rhythm, normal intervals. No evidence of ischemia.  FOLLOW UP PLANS AND APPOINTMENTS    Medication List     As of 11/23/2011 10:00 AM    STOP taking these medications  ACID REDUCER PO      TAKE these medications         aspirin EC 81 MG tablet   Take 81 mg by mouth daily.      atorvastatin 40 MG tablet   Commonly known as: LIPITOR   Take 1 tablet (40 mg total) by mouth daily at 6 PM.      carvedilol 3.125 MG tablet   Commonly known as: COREG   Take 1 tablet (3.125 mg total) by mouth 2 (two) times daily with a meal.      pantoprazole 40 MG tablet   Commonly known as: PROTONIX   Take 1 tablet (40 mg total) by mouth daily with breakfast.      Ticagrelor 90 MG Tabs tablet   Commonly known as: BRILINTA   Take 1 tablet (90 mg total) by mouth 2 (two) times daily.           Follow-up Information    Call Pamella Pert, MD. (To be seen in two weeks)    Contact information:   [address]  [place] [address] [phone]           Pamella Pert, MD 11/23/2011, 10:00 AM

## 2014-01-15 ENCOUNTER — Encounter (HOSPITAL_COMMUNITY): Payer: Self-pay | Admitting: Cardiology

## 2015-04-23 ENCOUNTER — Other Ambulatory Visit: Payer: Self-pay | Admitting: Family Medicine

## 2015-04-23 DIAGNOSIS — R1312 Dysphagia, oropharyngeal phase: Secondary | ICD-10-CM

## 2015-04-30 ENCOUNTER — Ambulatory Visit
Admission: RE | Admit: 2015-04-30 | Discharge: 2015-04-30 | Disposition: A | Discharge status: Home / Self Care | Payer: BLUE CROSS/BLUE SHIELD | Source: Ambulatory Visit | Attending: Family Medicine | Admitting: Family Medicine

## 2015-04-30 DIAGNOSIS — R1312 Dysphagia, oropharyngeal phase: Secondary | ICD-10-CM

## 2015-06-07 DIAGNOSIS — J069 Acute upper respiratory infection, unspecified: Secondary | ICD-10-CM | POA: Diagnosis not present

## 2015-06-07 DIAGNOSIS — R062 Wheezing: Secondary | ICD-10-CM | POA: Diagnosis not present

## 2015-06-16 DIAGNOSIS — R739 Hyperglycemia, unspecified: Secondary | ICD-10-CM | POA: Diagnosis not present

## 2015-06-16 DIAGNOSIS — E785 Hyperlipidemia, unspecified: Secondary | ICD-10-CM | POA: Diagnosis not present

## 2015-07-20 DIAGNOSIS — I491 Atrial premature depolarization: Secondary | ICD-10-CM | POA: Diagnosis not present

## 2015-07-20 DIAGNOSIS — I25118 Atherosclerotic heart disease of native coronary artery with other forms of angina pectoris: Secondary | ICD-10-CM | POA: Diagnosis not present

## 2015-07-20 DIAGNOSIS — R739 Hyperglycemia, unspecified: Secondary | ICD-10-CM | POA: Diagnosis not present

## 2015-07-20 DIAGNOSIS — E785 Hyperlipidemia, unspecified: Secondary | ICD-10-CM | POA: Diagnosis not present

## 2015-07-22 DIAGNOSIS — R0609 Other forms of dyspnea: Secondary | ICD-10-CM | POA: Diagnosis not present

## 2015-07-22 DIAGNOSIS — I25118 Atherosclerotic heart disease of native coronary artery with other forms of angina pectoris: Secondary | ICD-10-CM | POA: Diagnosis not present

## 2015-07-22 DIAGNOSIS — I4892 Unspecified atrial flutter: Secondary | ICD-10-CM | POA: Diagnosis not present

## 2015-07-27 ENCOUNTER — Encounter: Payer: Self-pay | Admitting: Internal Medicine

## 2015-07-27 DIAGNOSIS — I25118 Atherosclerotic heart disease of native coronary artery with other forms of angina pectoris: Secondary | ICD-10-CM | POA: Diagnosis not present

## 2015-07-27 DIAGNOSIS — I4892 Unspecified atrial flutter: Secondary | ICD-10-CM | POA: Diagnosis not present

## 2015-07-27 DIAGNOSIS — R0609 Other forms of dyspnea: Secondary | ICD-10-CM | POA: Diagnosis not present

## 2015-07-28 DIAGNOSIS — R0602 Shortness of breath: Secondary | ICD-10-CM | POA: Diagnosis not present

## 2015-07-28 DIAGNOSIS — I10 Essential (primary) hypertension: Secondary | ICD-10-CM | POA: Diagnosis not present

## 2015-07-28 DIAGNOSIS — I4892 Unspecified atrial flutter: Secondary | ICD-10-CM | POA: Diagnosis not present

## 2015-08-02 DIAGNOSIS — R0602 Shortness of breath: Secondary | ICD-10-CM | POA: Diagnosis not present

## 2015-08-02 DIAGNOSIS — I2510 Atherosclerotic heart disease of native coronary artery without angina pectoris: Secondary | ICD-10-CM | POA: Diagnosis not present

## 2015-08-02 DIAGNOSIS — I4892 Unspecified atrial flutter: Secondary | ICD-10-CM | POA: Diagnosis not present

## 2015-08-03 ENCOUNTER — Encounter: Payer: Self-pay | Admitting: Internal Medicine

## 2015-08-03 ENCOUNTER — Ambulatory Visit (INDEPENDENT_AMBULATORY_CARE_PROVIDER_SITE_OTHER): Payer: BLUE CROSS/BLUE SHIELD | Admitting: Internal Medicine

## 2015-08-03 VITALS — BP 116/74 | HR 46 | Ht 69.5 in | Wt 221.6 lb

## 2015-08-03 DIAGNOSIS — I4892 Unspecified atrial flutter: Secondary | ICD-10-CM

## 2015-08-03 MED ORDER — ASPIRIN EC 81 MG PO TBEC: 81.0000 mg | DELAYED_RELEASE_TABLET | Freq: Every day | ORAL | Status: AC

## 2015-08-03 NOTE — Patient Instructions (Signed)
Medication Instructions: - Your physician has recommended you make the following change in your medication:  1) Stop digoxin 2) Stop Eliquis 3) Decrease aspirin to 81 mg one tablet by mouth once daily  Labwork: - none  Procedures/Testing: - none  Follow-Up: - Dr. Caryl Comes will see you back on an as needed basis.  Any Additional Special Instructions Will Be Listed Below (If Applicable).     If you need a refill on your cardiac medications before your next appointment, please call your pharmacy.

## 2015-08-03 NOTE — Progress Notes (Signed)
ELECTROPHYSIOLOGY CONSULT NOTE  Patient ID: Adam Calderon, MRN: [id number], DOB/AGE: Jul 19, 1960 55 y.o. Admit date: (Not on file) Date of Consult: 08/03/2015  Primary Physician: No primary care provider on file. Primary Cardiologist: [facility] *Kinde Chief Complaint: atrial fibrillation   HPI Adam Calderon is a 55 y.o. male  Referred with a diagnosis of atrial fibrillation.  He has a known history of coronary artery disease. He underwent stenting of his mid LAD and proximal RCA  10/13; He had no residual circumflex disease. Recent stress testing by verbal report was negative. Heart rate excursion information was not available. In this regard is noteworthy that he has a history of bradycardia.  Over recent 6-8 weeks he has noted increasing problems with shortness of breath and palpitations. Retrospectively, he has noted problems with exercise intolerance over the last 2-3 years.  His palpitations with short lived typically lasting 5-10 seconds. They can occur a couple of times in the day. He noted since their onset problems with lightheadedness with bending nocturnal dyspnea and dyspnea on exertion worsening.   He has not had nocturnal dyspnea or orthopnea. He has had no syncope.  He has treated sleep apnea.  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  Following having been seen earlier this month with these rhythms, and has history of persistent palpitations, it was felt that he had atrial fibrillation and he was started on apixoban. Digoxin was added for rate control. He is undergoing echocardiogram which by verbal report was normal with normal LA size.  He is sleep-disordered breathing. He has daytime somnolence.  He struggles with his weight.       Past Medical History  Diagnosis Date  . Acid reflux       Surgical History:  Past Surgical History  Procedure Laterality Date  . Hernia  repair    . Left heart catheterization with coronary angiogram N/A 11/22/2011    Procedure: LEFT HEART CATHETERIZATION WITH CORONARY ANGIOGRAM;  Surgeon: Laverda Page, MD;  Location: [facility] CATH LAB;  Service: Cardiovascular;  Laterality: N/A;     Home Meds: Prior to Admission medications   Medication Sig Start Date End Date Taking? Authorizing Provider  apixaban (ELIQUIS) 5 MG TABS tablet Take 5 mg by mouth 2 (two) times daily.   Yes Historical Provider, MD  aspirin 325 MG tablet Take 325 mg by mouth daily.   Yes Historical Provider, MD  atorvastatin (LIPITOR) 40 MG tablet Take 1 tablet (40 mg total) by mouth daily at 6 PM. 11/23/11  Yes Adrian Prows, MD  DIGOXIN PO Take 250 mcg by mouth daily.    Yes Historical Provider, MD  Fish Oil-Cholecalciferol (FISH OIL + D3 PO) Take 1 capsule by mouth daily.   Yes Historical Provider, MD  METOPROLOL TARTRATE PO Take 25 mg by mouth daily. Patient unsure of dosage 1 tablet by mouth 2 times a day   Yes Historical Provider, MD  pantoprazole (PROTONIX) 40 MG tablet Take 1 tablet (40 mg total) by mouth daily with breakfast. 11/23/11  Yes Adrian Prows, MD    Allergies:  Allergies  Allergen Reactions  . Amoxapine And Related Itching and Rash    Social History   Social History  . Marital Status: Single    Spouse Name: N/A  . Number of Children: N/A  . Years of Education: N/A   Occupational History  . Not on file.  Social History Main Topics  . Smoking status: Never Smoker   . Smokeless tobacco: Never Used  . Alcohol Use: No     Comment: Quit years ago  . Drug Use: No     Comment: H/O Heroin use.  Marland Kitchen Sexual Activity: Not on file   Other Topics Concern  . Not on file   Social History Narrative     No family history on file.   ROS:  Please see the history of present illness.     All other systems reviewed and negative.    Physical Exam:   Blood pressure 116/74, pulse 46, height 5' 9.5" (1.765 m), weight 221 lb 9.6 oz (100.517  kg). General: Well developed, well nourished male in no acute distress. Head: Normocephalic, atraumatic, sclera non-icteric, no xanthomas, nares are without discharge. EENT: normal  Lymph Nodes:  none Neck: Negative for carotid bruits. JVD not elevated. Back:without scoliosis kyphosis  Lungs: Clear bilaterally to auscultation without wheezes, rales, or rhonchi. Breathing is unlabored. Heart: RRR with S1 S2. No * * murmur . No rubs, or gallops appreciated. Abdomen: Soft, non-tender, non-distended with normoactive bowel sounds. No hepatomegaly. No rebound/guarding. No obvious abdominal masses. Msk:  Strength and tone appear normal for age. Extremities: No clubbing or cyanosis. No  edema.  Distal pedal pulses are 2+ and equal bilaterally. Skin: Warm and Dry Neuro: Alert and oriented X 3. CN III-XII intact Grossly normal sensory and motor function . Psych:  Responds to questions appropriately with a normal affect.      Labs: Cardiac Enzymes No results for input(s): CKTOTAL, CKMB, TROPONINI in the last 72 hours. CBC Lab Results  Component Value Date   WBC 7.9 11/23/2011   HGB 15.2 11/23/2011   HCT 42.5 11/23/2011   MCV 85.2 11/23/2011   PLT 235 11/23/2011   PROTIME: No results for input(s): LABPROT, INR in the last 72 hours. Chemistry No results for input(s): NA, K, CL, CO2, BUN, CREATININE, CALCIUM, PROT, BILITOT, ALKPHOS, ALT, AST, GLUCOSE in the last 168 hours.  Invalid input(s): LABALBU Lipids Lab Results  Component Value Date   CHOL 226* 11/22/2011   HDL 33* 11/22/2011   LDLCALC 158* 11/22/2011   TRIG 173* 11/22/2011   BNP No results found for: PROBNP Thyroid Function Tests: No results for input(s): TSH, T4TOTAL, T3FREE, THYROIDAB in the last 72 hours.  Invalid input(s): FREET3 Miscellaneous No results found for: DDIMER  Radiology/Studies:  No results found.  EKG:  Sinus rhythm at 46 Intervals 14/09/43   ECGs were reviewed with Dr. Irven Shelling office. They  demonstrated short runs of nonsustained atrial tachycardia in the context of a atrial rate of about 90-100 bpm  Assessment and Plan:  Atrial tachycardia  Sinus bradycardia  Dyspnea on exertion  Obesity  Review of strips demonstrates atrial tach with short runs-- there is no recorded atrial fibrillation.  Hence i would discontinue apixoban.  Further given the concerns of morbidity with dig particularly in patients with normal LV function and his bradycardia which is worsening I will take the liberty of discontinuing his digoxin.  We will continue him on low-dose metoprolol which he thinks has had a significant impact on his atrial arrhythmia. Alternatives for his atrial arrhythmia are not readily apparent. From an antiarrhythmic point of view, dofetilide we'll probably be our most reasonable choice given his resting bradycardia. Certainly he would not be a candidate for flecainide with his coronary disease.  Catheter ablation could be considered if his burden of atrial ectopy was  sufficient. In the event that he has recurrent symptoms, I would undertake a 48 hour Holter monitor to assess his burden.  I don't know why he is so dyspneic.  He recently underwent stress testing and these data would be useful and clarifying chronotropic competence.  I have also advised him to decrease his aspirin from 325--81. He is to get up with Dr. Lavone Nian 2 address the issue of dual antiplatelet therapy   Virl Axe

## 2015-08-06 DIAGNOSIS — I471 Supraventricular tachycardia: Secondary | ICD-10-CM | POA: Diagnosis not present

## 2015-08-06 DIAGNOSIS — I25118 Atherosclerotic heart disease of native coronary artery with other forms of angina pectoris: Secondary | ICD-10-CM | POA: Diagnosis not present

## 2015-08-06 DIAGNOSIS — R0609 Other forms of dyspnea: Secondary | ICD-10-CM | POA: Diagnosis not present

## 2015-08-17 ENCOUNTER — Encounter: Payer: Self-pay | Admitting: Internal Medicine

## 2015-08-21 DIAGNOSIS — R0683 Snoring: Secondary | ICD-10-CM | POA: Diagnosis not present

## 2015-08-30 DIAGNOSIS — R0683 Snoring: Secondary | ICD-10-CM | POA: Diagnosis not present

## 2016-01-12 DIAGNOSIS — R7303 Prediabetes: Secondary | ICD-10-CM | POA: Diagnosis not present

## 2016-01-12 DIAGNOSIS — E7800 Pure hypercholesterolemia, unspecified: Secondary | ICD-10-CM | POA: Diagnosis not present

## 2016-01-12 DIAGNOSIS — I2510 Atherosclerotic heart disease of native coronary artery without angina pectoris: Secondary | ICD-10-CM | POA: Diagnosis not present

## 2016-01-12 DIAGNOSIS — Z0000 Encounter for general adult medical examination without abnormal findings: Secondary | ICD-10-CM | POA: Diagnosis not present

## 2016-01-12 DIAGNOSIS — I10 Essential (primary) hypertension: Secondary | ICD-10-CM | POA: Diagnosis not present

## 2016-01-12 DIAGNOSIS — Z125 Encounter for screening for malignant neoplasm of prostate: Secondary | ICD-10-CM | POA: Diagnosis not present

## 2016-02-15 DIAGNOSIS — R0609 Other forms of dyspnea: Secondary | ICD-10-CM | POA: Diagnosis not present

## 2016-02-15 DIAGNOSIS — Z8679 Personal history of other diseases of the circulatory system: Secondary | ICD-10-CM | POA: Diagnosis not present

## 2016-02-15 DIAGNOSIS — I25118 Atherosclerotic heart disease of native coronary artery with other forms of angina pectoris: Secondary | ICD-10-CM | POA: Diagnosis not present

## 2016-02-15 DIAGNOSIS — E785 Hyperlipidemia, unspecified: Secondary | ICD-10-CM | POA: Diagnosis not present

## 2016-02-25 DIAGNOSIS — R599 Enlarged lymph nodes, unspecified: Secondary | ICD-10-CM | POA: Diagnosis not present

## 2016-05-03 DIAGNOSIS — R0609 Other forms of dyspnea: Secondary | ICD-10-CM | POA: Diagnosis not present

## 2016-05-03 DIAGNOSIS — Z8679 Personal history of other diseases of the circulatory system: Secondary | ICD-10-CM | POA: Diagnosis not present

## 2016-05-03 DIAGNOSIS — I25118 Atherosclerotic heart disease of native coronary artery with other forms of angina pectoris: Secondary | ICD-10-CM | POA: Diagnosis not present

## 2016-05-03 DIAGNOSIS — R0789 Other chest pain: Secondary | ICD-10-CM | POA: Diagnosis not present

## 2016-07-07 DIAGNOSIS — R159 Full incontinence of feces: Secondary | ICD-10-CM | POA: Diagnosis not present

## 2016-07-07 DIAGNOSIS — K219 Gastro-esophageal reflux disease without esophagitis: Secondary | ICD-10-CM | POA: Diagnosis not present

## 2016-07-07 DIAGNOSIS — I10 Essential (primary) hypertension: Secondary | ICD-10-CM | POA: Diagnosis not present

## 2016-07-10 DIAGNOSIS — R159 Full incontinence of feces: Secondary | ICD-10-CM | POA: Diagnosis not present

## 2016-11-06 DIAGNOSIS — R0609 Other forms of dyspnea: Secondary | ICD-10-CM | POA: Diagnosis not present

## 2016-11-06 DIAGNOSIS — R0789 Other chest pain: Secondary | ICD-10-CM | POA: Diagnosis not present

## 2016-11-06 DIAGNOSIS — I25118 Atherosclerotic heart disease of native coronary artery with other forms of angina pectoris: Secondary | ICD-10-CM | POA: Diagnosis not present

## 2017-01-02 ENCOUNTER — Ambulatory Visit (INDEPENDENT_AMBULATORY_CARE_PROVIDER_SITE_OTHER): Payer: BLUE CROSS/BLUE SHIELD | Admitting: Sports Medicine

## 2017-01-02 ENCOUNTER — Ambulatory Visit (INDEPENDENT_AMBULATORY_CARE_PROVIDER_SITE_OTHER): Payer: BLUE CROSS/BLUE SHIELD

## 2017-01-02 ENCOUNTER — Encounter: Payer: Self-pay | Admitting: Sports Medicine

## 2017-01-02 DIAGNOSIS — S6992XA Unspecified injury of left wrist, hand and finger(s), initial encounter: Secondary | ICD-10-CM | POA: Insufficient documentation

## 2017-01-02 DIAGNOSIS — M7989 Other specified soft tissue disorders: Secondary | ICD-10-CM | POA: Diagnosis not present

## 2017-01-02 DIAGNOSIS — M25532 Pain in left wrist: Secondary | ICD-10-CM | POA: Diagnosis not present

## 2017-01-02 DIAGNOSIS — Y9361 Activity, american tackle football: Secondary | ICD-10-CM | POA: Diagnosis not present

## 2017-01-02 DIAGNOSIS — W19XXXA Unspecified fall, initial encounter: Secondary | ICD-10-CM | POA: Diagnosis not present

## 2017-01-02 MED ORDER — MELOXICAM 15 MG PO TABS: ORAL_TABLET | ORAL | 3 refills | Status: DC

## 2017-01-02 NOTE — Progress Notes (Signed)
   Subjective:    I'm seeing this patient as a consultation for: Dr. Lujean Amel  CC: Left wrist injury  HPI: 1 month ago this pleasant 56 year old male was playing football in the street, he fell backwards onto both outstretched hands, he had immediate pain in his left wrist along the radial aspect, no swelling, no bruising.  Unfortunately for a month now he is continued to have pain.  Moderate, persistent, localized at the radial aspect as well as dorsal aspect.    Past medical history, Surgical history, Family history not pertinant except as noted below, Social history, Allergies, and medications have been entered into the medical record, reviewed, and no changes needed.   Review of Systems: No headache, visual changes, nausea, vomiting, diarrhea, constipation, dizziness, abdominal pain, skin rash, fevers, chills, night sweats, weight loss, swollen lymph nodes, body aches, joint swelling, muscle aches, chest pain, shortness of breath, mood changes, visual or auditory hallucinations.   Objective:   General: Well Developed, well nourished, and in no acute distress.  Neuro:  Extra-ocular muscles intact, able to move all 4 extremities, sensation grossly intact.  Deep tendon reflexes tested were normal. Psych: Alert and oriented, mood congruent with affect. ENT:  Ears and nose appear unremarkable.  Hearing grossly normal. Neck: Unremarkable overall appearance, trachea midline.  No visible thyroid enlargement. Eyes: Conjunctivae and lids appear unremarkable.  Pupils equal and round. Skin: Warm and dry, no rashes noted.  Cardiovascular: Pulses palpable, no extremity edema. Left wrist: Inspection normal with no visible erythema or swelling. ROM smooth and normal with good flexion and extension and ulnar/radial deviation that is symmetrical with opposite wrist.  There is reproduction of pain with terminal extension Palpation is normal over metacarpals, navicular, lunate, and TFCC; tendons  without tenderness/ swelling No snuffbox tenderness. No tenderness over Canal of [place]. Strength 5/5 in all directions without pain. Negative tinel's and phalens signs. Only a mildly positive Finkelstein sign Negative Watson's test.  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  Impression and Recommendations:   This case required medical decision making of moderate complexity.  Left wrist injury Pain for a month now after a fall onto an outstretched hand. X-rays show scaphotrapeziotrapezoidal osteoarthritis, clinically his symptoms are more of a wrist sprain. Switching his wrist brace to an actual thumb spica thumb immobilization, adding meloxicam. Wrist exercises given. Return in 2-4 weeks, wrist joint injection if no better, I will probably add some medication both in the radiocarpal joint and into the STT joint doing the injection if he is not any better. ___________________________________________ Gwen Her. Dianah Field, M.D., ABFM., CAQSM. Primary Care and [place] Instructor of [employer] [facility] [employer]

## 2017-01-02 NOTE — Assessment & Plan Note (Signed)
Pain for a month now after a fall onto an outstretched hand. X-rays show scaphotrapeziotrapezoidal osteoarthritis, clinically his symptoms are more of a wrist sprain. Switching his wrist brace to an actual thumb spica thumb immobilization, adding meloxicam. Wrist exercises given. Return in 2-4 weeks, wrist joint injection if no better, I will probably add some medication both in the radiocarpal joint and into the STT joint doing the injection if he is not any better.

## 2017-01-11 DIAGNOSIS — I10 Essential (primary) hypertension: Secondary | ICD-10-CM | POA: Diagnosis not present

## 2017-01-11 DIAGNOSIS — R7303 Prediabetes: Secondary | ICD-10-CM | POA: Diagnosis not present

## 2017-01-11 DIAGNOSIS — Z125 Encounter for screening for malignant neoplasm of prostate: Secondary | ICD-10-CM | POA: Diagnosis not present

## 2017-01-11 DIAGNOSIS — I2510 Atherosclerotic heart disease of native coronary artery without angina pectoris: Secondary | ICD-10-CM | POA: Diagnosis not present

## 2017-01-11 DIAGNOSIS — Z0000 Encounter for general adult medical examination without abnormal findings: Secondary | ICD-10-CM | POA: Diagnosis not present

## 2017-01-11 DIAGNOSIS — E7800 Pure hypercholesterolemia, unspecified: Secondary | ICD-10-CM | POA: Diagnosis not present

## 2017-01-23 ENCOUNTER — Ambulatory Visit: Payer: BLUE CROSS/BLUE SHIELD | Admitting: Sports Medicine

## 2017-05-09 DIAGNOSIS — R0609 Other forms of dyspnea: Secondary | ICD-10-CM | POA: Diagnosis not present

## 2017-05-09 DIAGNOSIS — I25118 Atherosclerotic heart disease of native coronary artery with other forms of angina pectoris: Secondary | ICD-10-CM | POA: Diagnosis not present

## 2017-05-09 DIAGNOSIS — E785 Hyperlipidemia, unspecified: Secondary | ICD-10-CM | POA: Diagnosis not present

## 2017-05-09 DIAGNOSIS — R400 Somnolence: Secondary | ICD-10-CM | POA: Diagnosis not present

## 2017-05-21 DIAGNOSIS — R0789 Other chest pain: Secondary | ICD-10-CM | POA: Diagnosis not present

## 2017-05-21 DIAGNOSIS — R0602 Shortness of breath: Secondary | ICD-10-CM | POA: Diagnosis not present

## 2017-06-20 DIAGNOSIS — R0789 Other chest pain: Secondary | ICD-10-CM | POA: Diagnosis not present

## 2017-06-20 DIAGNOSIS — R0609 Other forms of dyspnea: Secondary | ICD-10-CM | POA: Diagnosis not present

## 2017-07-03 DIAGNOSIS — R400 Somnolence: Secondary | ICD-10-CM | POA: Diagnosis not present

## 2017-07-03 DIAGNOSIS — R0609 Other forms of dyspnea: Secondary | ICD-10-CM | POA: Diagnosis not present

## 2017-07-03 DIAGNOSIS — E785 Hyperlipidemia, unspecified: Secondary | ICD-10-CM | POA: Diagnosis not present

## 2017-07-03 DIAGNOSIS — I25118 Atherosclerotic heart disease of native coronary artery with other forms of angina pectoris: Secondary | ICD-10-CM | POA: Diagnosis not present

## 2017-07-19 DIAGNOSIS — L821 Other seborrheic keratosis: Secondary | ICD-10-CM | POA: Diagnosis not present

## 2017-07-19 DIAGNOSIS — L918 Other hypertrophic disorders of the skin: Secondary | ICD-10-CM | POA: Diagnosis not present

## 2018-01-21 DIAGNOSIS — I10 Essential (primary) hypertension: Secondary | ICD-10-CM | POA: Diagnosis not present

## 2018-01-21 DIAGNOSIS — Z0000 Encounter for general adult medical examination without abnormal findings: Secondary | ICD-10-CM | POA: Diagnosis not present

## 2018-01-21 DIAGNOSIS — R7303 Prediabetes: Secondary | ICD-10-CM | POA: Diagnosis not present

## 2018-01-21 DIAGNOSIS — I2510 Atherosclerotic heart disease of native coronary artery without angina pectoris: Secondary | ICD-10-CM | POA: Diagnosis not present

## 2018-01-21 DIAGNOSIS — E7800 Pure hypercholesterolemia, unspecified: Secondary | ICD-10-CM | POA: Diagnosis not present

## 2018-04-03 DIAGNOSIS — K625 Hemorrhage of anus and rectum: Secondary | ICD-10-CM | POA: Diagnosis not present

## 2018-06-28 ENCOUNTER — Other Ambulatory Visit: Payer: Self-pay | Admitting: Cardiology

## 2018-07-03 ENCOUNTER — Other Ambulatory Visit: Payer: Self-pay

## 2018-07-03 ENCOUNTER — Ambulatory Visit: Payer: Self-pay | Admitting: Cardiology

## 2018-07-03 ENCOUNTER — Encounter: Payer: Self-pay | Admitting: Cardiology

## 2018-07-03 DIAGNOSIS — I2510 Atherosclerotic heart disease of native coronary artery without angina pectoris: Secondary | ICD-10-CM | POA: Insufficient documentation

## 2018-07-03 NOTE — Progress Notes (Deleted)
Primary Physician/Referring:  Lujean Amel, MD  Patient ID: Adam Calderon, male    DOB: 07/12/1960, 58 y.o.   MRN: [id number]  No chief complaint on file.   HPI: Adam Calderon  is a 58 y.o. male  with  coronary artery disease and angioplasty to his LAD and right coronary artery in September 2013, hyperlipidemia, mild obesity and remote history of cocaine and marijuana abuse presents here for 6 month follow-up.  Patient presents here for follow-up of atypical chest pain and dyspnea, underwent GXT and echocardiogram. States since knowing his treadmill stress test was low risk, he has started to exercise and has lost some weight and his exercise capacity is increased and his dyspnea has improved. He has not had recurrence of chest pain. No PND or orthopnea, has not used any sublingual nitroglycerin.  ***  Past Medical History:  Diagnosis Date  . Acid reflux   . Unstable angina pectoris status post multiple stents 11/22/2011    *** The histories are not reviewed yet. Please review them in the "History" navigator section and refresh this [place].  Social History   Socioeconomic History  . Marital status: Single    Spouse name: Not on file  . Number of children: Not on file  . Years of education: Not on file  . Highest education level: Not on file  Occupational History  . Not on file  Social Needs  . Financial resource strain: Not on file  . Food insecurity:    Worry: Not on file    Inability: Not on file  . Transportation needs:    Medical: Not on file    Non-medical: Not on file  Tobacco Use  . Smoking status: Never Smoker  . Smokeless tobacco: Never Used  Substance and Sexual Activity  . Alcohol use: No    Comment: Quit years ago  . Drug use: No    Comment: H/O Heroin use.  Marland Kitchen Sexual activity: Not on file  Lifestyle  . Physical activity:    Days per week: Not on file    Minutes per session: Not on file  . Stress: Not on file  Relationships  . Social  connections:    Talks on phone: Not on file    Gets together: Not on file    Attends religious service: Not on file    Active member of club or organization: Not on file    Attends meetings of clubs or organizations: Not on file    Relationship status: Not on file  . Intimate partner violence:    Fear of current or ex partner: Not on file    Emotionally abused: Not on file    Physically abused: Not on file    Forced sexual activity: Not on file  Other Topics Concern  . Not on file  Social History Narrative  . Not on file    ***Review of Systems  Constitution: Negative for chills, decreased appetite, malaise/fatigue and weight gain.  Cardiovascular: Negative for dyspnea on exertion, leg swelling and syncope.  Endocrine: Negative for cold intolerance.  Hematologic/Lymphatic: Does not bruise/bleed easily.  Musculoskeletal: Negative for joint swelling.  Gastrointestinal: Negative for abdominal pain, anorexia, change in bowel habit, hematochezia and melena.  Neurological: Negative for headaches and light-headedness.  Psychiatric/Behavioral: Negative for depression and substance abuse.  All other systems reviewed and are negative.     Objective  There were no vitals taken for this visit. There is no height or weight on file to calculate BMI.    ***  Physical Exam  Constitutional: He appears well-developed and well-nourished. No distress.  Muscular and mildly obese  HENT:  Head: Atraumatic.  Eyes: Conjunctivae are normal.  Neck: Neck supple. No JVD present. No thyromegaly present.  Cardiovascular: Regular rhythm, normal heart sounds, intact distal pulses and normal pulses. Bradycardia present. Exam reveals no gallop.  No murmur heard. Pulmonary/Chest: Effort normal and breath sounds normal.  Abdominal: Soft. Bowel sounds are normal.  Musculoskeletal: Normal range of motion.  Neurological: He is alert.  Skin: Skin is warm and dry.  Psychiatric: He has a normal mood and affect.    Radiology: No results found.  Laboratory examination:   Labs 07/03/2017: CBC normal, HB 15.6/platelets 278.  Serum glucose 116 mg, BUN 14, creatinine 1.04, eGFR greater than 60 ML.  Total cholesterol 115, triglycerides 120, HDL 30, LDL 61 and TSH normal  PRN Meds:. There are no discontinued medications. No outpatient medications have been marked as taking for the 07/03/18 encounter (Appointment) with Adrian Prows, MD.    Cardiac Studies:   Coronary angiogram 11/22/11: PTCA and stenting of the Mid LAD with 3.5 x 23 mm Xience xpedition stent and proximal RCA with implantation of a 4.0 x 23 mm Xience epedition stent. Mid segment of circumflex shows a 50-60%  stenosis and a small OM-1 shows a 70% stenosis.  Exercise sestamibi stress test 08/02/2015: 1. The resting electrocardiogram demonstrated normal sinus rhythm, normal resting conduction, no resting arrhythmias and normal rest repolarization.  The stress electrocardiogram was normal.  Patient exercised on Bruce protocol for 9:30 minutes and achieved 10.96 METS. Stress test terminated due to target heart rate( 89% MPHR). Stress symptoms included dyspnea. 2. Myocardial perfusion imaging is normal. Overall left ventricular systolic function was normal without regional wall motion abnormalities. The left ventricular ejection fraction was 54%.  Exercise Treadmill Stress Test 05/21/2017:  Indication: Chest pain, SOB The patient exercised on Bruce protocol for  09:22 min. Patient achieved  10.76 METS and reached HR  146 bpm, which is   87% of maximum age-predicted HR.  Stress test terminated due to SOB.  Exercise capacity was normal.  HR Response to Exercise: Appropriate. BP Response to Exercise: Normal resting BP- appropriate response. Chest Pain: non-limiting. Arrhythmias: none.  ST Changes:  While interpretation is limited due to significat artifact, no overt ischemic changes seen at peak exercise.   Impression:  Normal exercise treadmill  stress test. If clinical suspicion is high, consider nuclear sress test given known h/o CAD.  Echocardiogram 06/20/2017: Left ventricle cavity is normal in size. Mild to moderate concentric hypertrophy of the left ventricle. Normal global wall motion. Normal diastolic filling pattern. Calculated EF 55%. Left atrial cavity is moderately dilated at 4.7 cm. Right atrial cavity is mildly dilated. Mild (Grade I) mitral regurgitation. Mild tricuspid regurgitation. Mild pulmonary hypertension. IVC is dilated with respiratory variation. Suggests elevated central venous pressure. No significant change from  07/28/2015.   Assessment   Coronary artery disease involving native coronary artery of native heart without angina pectoris - 11/22/11: stenting mid LAD 3.5 x 23 mm Xience xpedition, proximal RCA 4.0 x 23 mm Xience epedition. Mid Cx 60%, OM1 70%  05/09/2017: Sinus bradycardia at rate of 53 bpm, normal axis.  No evidence of ischemia, normal EKG. No significant change from EKG 11/06/2016.  Recommendations:   ***  Adrian Prows, MD, [facility] 07/03/2018, 4:53 AM [facility]. [place] Pager: [phone] Office: [phone] If no answer Cell [phone]

## 2018-08-01 ENCOUNTER — Other Ambulatory Visit: Payer: Self-pay

## 2018-08-01 ENCOUNTER — Ambulatory Visit (INDEPENDENT_AMBULATORY_CARE_PROVIDER_SITE_OTHER): Payer: BC Managed Care – PPO | Admitting: Cardiology

## 2018-08-01 ENCOUNTER — Encounter: Payer: Self-pay | Admitting: Cardiology

## 2018-08-01 VITALS — BP 128/81 | HR 48 | Ht 70.0 in | Wt 237.5 lb

## 2018-08-01 DIAGNOSIS — R001 Bradycardia, unspecified: Secondary | ICD-10-CM | POA: Diagnosis not present

## 2018-08-01 DIAGNOSIS — E782 Mixed hyperlipidemia: Secondary | ICD-10-CM

## 2018-08-01 DIAGNOSIS — K219 Gastro-esophageal reflux disease without esophagitis: Secondary | ICD-10-CM

## 2018-08-01 DIAGNOSIS — R739 Hyperglycemia, unspecified: Secondary | ICD-10-CM | POA: Diagnosis not present

## 2018-08-01 DIAGNOSIS — I2510 Atherosclerotic heart disease of native coronary artery without angina pectoris: Secondary | ICD-10-CM

## 2018-08-01 MED ORDER — PANTOPRAZOLE SODIUM 40 MG PO TBEC: 40.0000 mg | DELAYED_RELEASE_TABLET | Freq: Every day | ORAL | 0 refills | Status: DC | PRN

## 2018-08-01 MED ORDER — METOPROLOL TARTRATE 25 MG PO TABS: 12.5000 mg | ORAL_TABLET | Freq: Two times a day (BID) | ORAL | 3 refills | Status: DC

## 2018-08-01 MED ORDER — FAMOTIDINE 20 MG PO TABS: 40.0000 mg | ORAL_TABLET | Freq: Every day | ORAL | 0 refills | Status: DC

## 2018-08-01 NOTE — Progress Notes (Signed)
Primary Physician/Referring:  Lujean Amel, MD  Patient ID: Adam Calderon, male    DOB: 1960/05/05, 58 y.o.   MRN: [id number]  Chief Complaint  Patient presents with  . Coronary Artery Disease  . Follow-up    35YR    HPI: Adam Calderon  is a 58 y.o. male  with coronary artery disease and angioplasty to his LAD and right coronary artery in September 2013, hyperlipidemia, mild obesity and remote history of cocaine and marijuana abuse presents here for annual follow-up.   Except for GERD, he has no specific symptoms.  Requested refill for Nexium.  This is his annual visit.  No chest pain, no shortness breath, no palpitations or dizziness or syncope.  He has remained abstinent from tobacco.  Past Medical History:  Diagnosis Date  . Acid reflux   . Unstable angina pectoris status post multiple stents 11/22/2011    Past Surgical History:  Procedure Laterality Date  . HERNIA REPAIR    . LEFT HEART CATHETERIZATION WITH CORONARY ANGIOGRAM N/A 11/22/2011   Procedure: LEFT HEART CATHETERIZATION WITH CORONARY ANGIOGRAM;  Surgeon: Laverda Page, MD;  Location: [facility] CATH LAB;  Service: Cardiovascular;  Laterality: N/A;    Social History   Socioeconomic History  . Marital status: Single    Spouse name: Not on file  . Number of children: 2  . Years of education: Not on file  . Highest education level: Not on file  Occupational History  . Not on file  Social Needs  . Financial resource strain: Not on file  . Food insecurity    Worry: Not on file    Inability: Not on file  . Transportation needs    Medical: Not on file    Non-medical: Not on file  Tobacco Use  . Smoking status: Former Smoker    Packs/day: 1.50    Years: 20.00    Pack years: 30.00    Types: Cigarettes    Quit date: 07/31/2005    Years since quitting: 13.0  . Smokeless tobacco: Never Used  Substance and Sexual Activity  . Alcohol use: No    Comment: Quit years ago  . Drug use: No    Comment: H/O Heroin  use.  Marland Kitchen Sexual activity: Not on file  Lifestyle  . Physical activity    Days per week: Not on file    Minutes per session: Not on file  . Stress: Not on file  Relationships  . Social Herbalist on phone: Not on file    Gets together: Not on file    Attends religious service: Not on file    Active member of club or organization: Not on file    Attends meetings of clubs or organizations: Not on file    Relationship status: Not on file  . Intimate partner violence    Fear of current or ex partner: Not on file    Emotionally abused: Not on file    Physically abused: Not on file    Forced sexual activity: Not on file  Other Topics Concern  . Not on file  Social History Narrative  . Not on file   Review of Systems  Constitution: Negative for chills, decreased appetite, malaise/fatigue and weight gain.  Cardiovascular: Negative for dyspnea on exertion, leg swelling and syncope.  Endocrine: Negative for cold intolerance.  Hematologic/Lymphatic: Does not bruise/bleed easily.  Musculoskeletal: Negative for joint swelling.  Gastrointestinal: Negative for abdominal pain, anorexia, change in bowel habit, hematochezia and  melena.  Neurological: Negative for headaches and light-headedness.  Psychiatric/Behavioral: Negative for depression and substance abuse.  All other systems reviewed and are negative.  Objective  Blood pressure 128/81, pulse (!) 48, height '5\' 10"'  (1.778 m), weight 237 lb 8 oz (107.7 kg), SpO2 97 %. Body mass index is 34.08 kg/m.    Physical Exam  Constitutional: He appears well-developed. No distress.  Muscular. Mildly obese  HENT:  Head: Atraumatic.  Eyes: Conjunctivae are normal.  Neck: Neck supple. No JVD present. No thyromegaly present.  Cardiovascular: Normal rate, regular rhythm, normal heart sounds and intact distal pulses. Exam reveals no gallop.  No murmur heard. Pulmonary/Chest: Effort normal and breath sounds normal.  Abdominal: Soft. Bowel  sounds are normal.  Musculoskeletal: Normal range of motion.  Neurological: He is alert.  Skin: Skin is warm and dry.  Psychiatric: He has a normal mood and affect.   Radiology: No results found.  Laboratory examination:   Labs 07/03/2017: CBC normal, HB 15.6/platelets 278.  Serum glucose 116 mg, BUN 14, creatinine 1.04, eGFR greater than 60 ML.  Total cholesterol 115, triglycerides 120, HDL 30, LDL 61 and TSH normal  CMP Latest Ref Rng & Units 11/23/2011 11/21/2011  Glucose 70 - 99 mg/dL 134(H) 97  BUN 6 - 23 mg/dL 13 11  Creatinine 0.50 - 1.35 mg/dL 0.96 0.87  Sodium 135 - 145 mEq/L 138 135  Potassium 3.5 - 5.1 mEq/L 3.3(L) 4.3  Chloride 96 - 112 mEq/L 103 99  CO2 19 - 32 mEq/L 24 27  Calcium 8.4 - 10.5 mg/dL 8.7 9.4  Total Protein 6.0 - 8.3 g/dL - 7.8  Total Bilirubin 0.3 - 1.2 mg/dL - 0.7  Alkaline Phos 39 - 117 U/L - 60  AST 0 - 37 U/L - 25  ALT 0 - 53 U/L - 36   CBC Latest Ref Rng & Units 11/23/2011 11/21/2011  WBC 4.0 - 10.5 K/uL 7.9 8.3  Hemoglobin 13.0 - 17.0 g/dL 15.2 16.1  Hematocrit 39.0 - 52.0 % 42.5 44.9  Platelets 150 - 400 K/uL 235 258   Lipid Panel     Component Value Date/Time   CHOL 226 (H) 11/22/2011 2054   TRIG 173 (H) 11/22/2011 2054   HDL 33 (L) 11/22/2011 2054   CHOLHDL 6.8 11/22/2011 2054   VLDL 35 11/22/2011 2054   LDLCALC 158 (H) 11/22/2011 2054   HEMOGLOBIN A1C Lab Results  Component Value Date   HGBA1C 5.7 (H) 11/23/2011   MPG 117 (H) 11/23/2011   TSH No results for input(s): TSH in the last 8760 hours.   Medications   Medications Discontinued During This Encounter  Medication Reason  . meloxicam (MOBIC) 15 MG tablet No longer needed (for PRN medications)  . pantoprazole (PROTONIX) 40 MG tablet Reorder  . METOPROLOL TARTRATE PO Reorder   Current Meds  Medication Sig  . aspirin EC 81 MG tablet Take 1 tablet (81 mg total) by mouth daily.  Marland Kitchen atorvastatin (LIPITOR) 80 MG tablet Take 1 tablet (80 mg total) by mouth daily.  .  cholecalciferol (VITAMIN D) 25 MCG (1000 UT) tablet Take 1,000 Units by mouth daily.  . Fish Oil-Cholecalciferol (FISH OIL + D3 PO) Take 1 capsule by mouth daily.  . pantoprazole (PROTONIX) 40 MG tablet Take 1 tablet (40 mg total) by mouth daily as needed (heart burn not relieved with pepcid).  Marland Kitchen ZINC-MAGNESIUM ASPART-VIT B6 PO Take by mouth daily.  . [DISCONTINUED] METOPROLOL TARTRATE PO Take 25 mg by mouth daily. 1/2  tab bid  . [DISCONTINUED] pantoprazole (PROTONIX) 40 MG tablet Take 1 tablet (40 mg total) by mouth daily with breakfast.   Cardiac Studies:   Heart Cath for [place]: 11/22/11: PTCA and stenting of the Mid LAD with 3.5 x 23 mm Xience xpedition stent and proximal RCA with implantation of a 4.0 x 23 mm Xience epedition stent. Mid segment of circumflex shows a 50-60%  stenosis and a small OM-1 shows a 70% stenosis.  Echocardiogram 06/20/2017: Left ventricle cavity is normal in size. Mild to moderate concentric hypertrophy of the left ventricle. Normal global wall motion. Normal diastolic filling pattern. Calculated EF 55%. Left atrial cavity is moderately dilated at 4.7 cm. Right atrial cavity is mildly dilated. Mild (Grade I) mitral regurgitation. Mild tricuspid regurgitation. Mild pulmonary hypertension. IVC is dilated with respiratory variation. Suggests elevated central venous pressure. No significant change from 07/28/2015.  Exercise Treadmill Stress Test 05/21/2017:  Indication: Chest pain, SOB The patient exercised on Bruce protocol for  09:22 min. Patient achieved  10.76 METS and reached HR  146 bpm, which is   87% of maximum age-predicted HR.  Stress test terminated due to SOB.  Exercise capacity was normal.  HR Response to Exercise: Appropriate. BP Response to Exercise: Normal resting BP- appropriate response. Chest Pain: non-limiting. Arrhythmias: none.  ST Changes:  While interpretation is limited due to significat artifact, no overt ischemic changes seen at peak  exercise.   Impression:  Normal exercise treadmill stress test. If clinical suspicion is high, consider nuclear sress test given known h/o CAD.  Assessment   Coronary artery disease involving native coronary artery of native heart without angina pectoris - 11/22/11:Mid LAD 3.5 x 23 mm Xience xpedition DES, prox RCA  4.0 x 23 mm Xience epedition DES, Cx 60%. - Plan: EKG 12-Lead, metoprolol tartrate (LOPRESSOR) 25 MG tablet  Hyperglycemia  Mixed hyperlipidemia  Bradycardia on ECG   Gastroesophageal reflux disease without esophagitis - Plan: pantoprazole (PROTONIX) 40 MG tablet, famotidine (PEPCID) 20 MG tablet   EKG 08/01/2018: Marked sinus bradycardia at rate of 44 bpm, normal axis.  No evidence of ischemia, otherwise normal EKG. No significant change from  05/09/2017  Recommendations:   Patient is here on annual visit and follow-up of coronary artery disease and hyperlipidemia, lipids are being taken oh by his PCP, last lipid profile testing a year ago was completely normal.  He remains angina free.  He continues to have marked sinus bradycardia completely asymptomatic and is on a low-dose beta blocker for CAD.  Continue the same.  With regard to GERD, he is being on Nexium long-term.  I discussed with him regarding the side effect of long-term PPI, prescribed him Nexium to be taken only on a p.r.n. basis and give him a prescription for Pepcid for his GERD.  Advised him to discuss with his GI regarding GERD and treatment of the same.  Otherwise he remains well, blood pressure is normal, no clinical evidence of heart failure, EKG is sinus rhythm without ischemia.  I'll see him back in 7 months for follow-up.  Adrian Prows, MD, [facility] 08/01/2018, 11:14 AM [facility]. [place] Pager: [phone] Office: [phone] If no answer Cell [phone]

## 2018-08-06 DIAGNOSIS — D124 Benign neoplasm of descending colon: Secondary | ICD-10-CM | POA: Diagnosis not present

## 2018-08-06 DIAGNOSIS — Z86010 Personal history of colonic polyps: Secondary | ICD-10-CM | POA: Diagnosis not present

## 2018-12-13 ENCOUNTER — Other Ambulatory Visit: Payer: Self-pay

## 2018-12-13 DIAGNOSIS — K219 Gastro-esophageal reflux disease without esophagitis: Secondary | ICD-10-CM

## 2018-12-13 MED ORDER — FAMOTIDINE 20 MG PO TABS: 40.0000 mg | ORAL_TABLET | Freq: Every day | ORAL | 0 refills | Status: DC

## 2018-12-13 MED ORDER — PANTOPRAZOLE SODIUM 40 MG PO TBEC: 40.0000 mg | DELAYED_RELEASE_TABLET | Freq: Every day | ORAL | 0 refills | Status: DC | PRN

## 2018-12-14 DIAGNOSIS — Z20828 Contact with and (suspected) exposure to other viral communicable diseases: Secondary | ICD-10-CM | POA: Diagnosis not present

## 2019-03-06 ENCOUNTER — Ambulatory Visit: Payer: BC Managed Care – PPO | Admitting: Cardiology

## 2019-03-06 ENCOUNTER — Other Ambulatory Visit: Payer: Self-pay

## 2019-03-06 ENCOUNTER — Encounter: Payer: Self-pay | Admitting: Cardiology

## 2019-03-06 VITALS — BP 160/97 | HR 56 | Temp 97.9°F | Ht 70.0 in | Wt 235.2 lb

## 2019-03-06 DIAGNOSIS — I10 Essential (primary) hypertension: Secondary | ICD-10-CM | POA: Diagnosis not present

## 2019-03-06 DIAGNOSIS — E782 Mixed hyperlipidemia: Secondary | ICD-10-CM

## 2019-03-06 DIAGNOSIS — I2510 Atherosclerotic heart disease of native coronary artery without angina pectoris: Secondary | ICD-10-CM | POA: Diagnosis not present

## 2019-03-06 MED ORDER — OLMESARTAN MEDOXOMIL-HCTZ 20-12.5 MG PO TABS: 1.0000 | ORAL_TABLET | ORAL | 3 refills | Status: DC

## 2019-03-06 NOTE — Progress Notes (Signed)
Primary Physician/Referring:  Lujean Amel, MD  Patient ID: Adam Calderon, male    DOB: [birth year], 59 y.o.   MRN: [id number]  Chief Complaint  Patient presents with  . Coronary Artery Disease  . Hyperlipidemia  . Follow-up    7 months   HPI:    Adam Calderon  is a 59 y.o. male  with coronary artery disease and angioplasty to his LAD and right coronary artery in September 2013, hyperlipidemia, mild obesity and remote history of cocaine and marijuana abuse presents here for 6 month follow-up. No chest pain, no shortness breath, no palpitations or dizziness or syncope.  He has remained abstinent from tobacco.  He is asymptomatic except for occasional sharp pain that last few seconds in his chest.  Past Medical History:  Diagnosis Date  . Acid reflux   . Unstable angina pectoris status post multiple stents 11/22/2011   Past Surgical History:  Procedure Laterality Date  . HERNIA REPAIR    . LEFT HEART CATHETERIZATION WITH CORONARY ANGIOGRAM N/A 11/22/2011   Procedure: LEFT HEART CATHETERIZATION WITH CORONARY ANGIOGRAM;  Surgeon: Laverda Page, MD;  Location: [facility] CATH LAB;  Service: Cardiovascular;  Laterality: N/A;   Social History   Tobacco Use  . Smoking status: Former Smoker    Packs/day: 1.50    Years: 20.00    Pack years: 30.00    Types: Cigarettes    Quit date: 07/31/2005    Years since quitting: 13.6  . Smokeless tobacco: Never Used  Substance Use Topics  . Alcohol use: No    Comment: Quit years ago    ROS  Review of Systems  Constitution: Negative for weight gain.  Cardiovascular: Negative for dyspnea on exertion, leg swelling and syncope.  Respiratory: Negative for hemoptysis.   Endocrine: Negative for cold intolerance.  Hematologic/Lymphatic: Does not bruise/bleed easily.  Gastrointestinal: Negative for hematochezia and melena.  Neurological: Negative for headaches and light-headedness.   Objective  Blood pressure (!) 160/97, pulse (!) 56,  temperature 97.9 F (36.6 C), height '5\' 10"'  (1.778 m), weight 235 lb 3.2 oz (106.7 kg), SpO2 98 %.  Vitals with BMI 03/06/2019 03/06/2019 08/01/2018  Height - '5\' 10"'  '5\' 10"'   Weight - 235 lbs 3 oz 237 lbs 8 oz  BMI - 41.74 08.14  Systolic 481 856 314  Diastolic 97 970 81  Pulse 56 59 48     Physical Exam  Constitutional:  Mildly obese, muscular  Neck: No thyromegaly present.  Cardiovascular: Normal rate, regular rhythm, normal heart sounds and intact distal pulses. Exam reveals no gallop.  No murmur heard. No leg edema, no JVD.  Pulmonary/Chest: Effort normal and breath sounds normal.  Abdominal: Soft. Bowel sounds are normal.  Musculoskeletal:     Cervical back: Neck supple.  Skin: Skin is warm and dry.   Laboratory examination:   External labs: 01/21/2018   Cholesterol, total 139   HDL 32  LDL 78  Triglycerides 148   A1C 5.600 01/21/2018 Creatinine, Serum 1.030 01/21/2018 Potassium 4.500 01/21/2018 ALT (SGPT) 31.000 01/21/2018  Labs 07/03/2017: CBC normal, HB 15.6/platelets 278.  Serum glucose 116 mg, BUN 14, creatinine 1.04, eGFR greater than 60 ML.  Total cholesterol 115, triglycerides 120, HDL 30, LDL 61 and TSH normal.  Medications and allergies   Allergies  Allergen Reactions  . Amoxapine And Related Itching and Rash     Current Outpatient Medications  Medication Instructions  . aspirin EC 81 mg, Oral, Daily  . atorvastatin (LIPITOR) 80 mg, Oral,  Daily  . cholecalciferol (VITAMIN D) 1,000 Units, Oral, Daily  . famotidine (PEPCID) 40 mg, Oral, Daily  . Fish Oil-Cholecalciferol (FISH OIL + D3 PO) 1 capsule, Oral, Daily  . metoprolol tartrate (LOPRESSOR) 12.5 mg, Oral, 2 times daily, 1/2 tab bid  . olmesartan-hydrochlorothiazide (BENICAR HCT) 20-12.5 MG tablet 1 tablet, Oral, BH-each morning  . pantoprazole (PROTONIX) 40 mg, Oral, Daily PRN  . ZINC-MAGNESIUM ASPART-VIT B6 PO Oral, Daily   Radiology:  No results found.  Cardiac Studies:   Left Heart  Catheterization 11/22/11:  PTCA and stenting of the Mid LAD with 3.5 x 23 mm Xience xpedition stent and proximal RCA with implantation of a 4.0 x 23 mm Xience epedition stent. Mid segment of circumflex shows a 50-60%  stenosis and a small OM-1 shows a 70% stenosis.  Echocardiogram 06/20/2017: Left ventricle cavity is normal in size. Mild to moderate concentric hypertrophy of the left ventricle. Normal global wall motion. Normal diastolic filling pattern. Calculated EF 55%. Left atrial cavity is moderately dilated at 4.7 cm. Right atrial cavity is mildly dilated. Mild (Grade I) mitral regurgitation. Mild tricuspid regurgitation. Mild pulmonary hypertension. IVC is dilated with respiratory variation. Suggests elevated central venous pressure. No significant change from 07/28/2015.  Exercise Treadmill Stress Test 05/21/2017:  Indication: Chest pain, SOB The patient exercised on Bruce protocol for  09:22 min. Patient achieved  10.76 METS and reached HR  146 bpm, which is   87% of maximum age-predicted HR.  Stress test terminated due to SOB.  Exercise capacity was normal.  HR Response to Exercise: Appropriate. BP Response to Exercise: Normal resting BP- appropriate response. Chest Pain: non-limiting. Arrhythmias: none.  ST Changes:  While interpretation is limited due to significat artifact, no overt ischemic changes seen at peak exercise.   Impression:  Normal exercise treadmill stress test. If clinical suspicion is high, consider nuclear sress test given known h/o CAD.  Assessment     ICD-10-CM   1. Coronary artery disease involving native coronary artery of native heart without angina pectoris  I25.10 EKG 12-Lead    CMP14+EGFR  2. Mixed hyperlipidemia  E78.2 Lipid Panel With LDL/HDL Ratio  3. Primary hypertension  I10 olmesartan-hydrochlorothiazide (BENICAR HCT) 20-12.5 MG tablet    EKG 03/06/2019: Sinus bradycardia at rate of 59 bpm, normal axis.  Poor R wave progression, probably  normal variant.  No evidence of ischemia, normal QT interval.  EKG 08/01/2018: Marked sinus bradycardia at rate of 44 bpm.   Meds ordered this encounter  Medications  . olmesartan-hydrochlorothiazide (BENICAR HCT) 20-12.5 MG tablet    Sig: Take 1 tablet by mouth every morning.    Dispense:  30 tablet    Refill:  3    There are no discontinued medications.   Recommendations:   Zeph Riebel  is a 59 y.o. male  with coronary artery disease and angioplasty to his LAD and right coronary artery in September 2013, hyperlipidemia, mild obesity and remote history of cocaine and marijuana abuse presents here for 6 month follow-up.   I reviewed his external records, labs within normal limits, LDL is slightly above recommended 70 mg.  Will recheck lipids. Patient is again willing to make lifestyle changes.  Blood pressure is again elevated, I will start him on Benicar HCT 20/12.5 mg in the morning, will obtain CMP and also lipid profile testing in 3 weeks to 4 weeks and await to see him back in 6 weeks for follow-up.  Except he has primary essential hypertension.  Blood pressures  also elevated on his prior office visit 6 months ago.  Physical examination is unchanged from prior, EKG also unchanged.   Adrian Prows, MD, [facility] 03/06/2019, [facility]. [place] Office: [phone]

## 2019-03-29 ENCOUNTER — Other Ambulatory Visit: Payer: Self-pay | Admitting: Cardiology

## 2019-03-29 DIAGNOSIS — K219 Gastro-esophageal reflux disease without esophagitis: Secondary | ICD-10-CM

## 2019-04-09 ENCOUNTER — Other Ambulatory Visit: Payer: Self-pay

## 2019-04-09 MED ORDER — ATORVASTATIN CALCIUM 80 MG PO TABS: 80.0000 mg | ORAL_TABLET | Freq: Every day | ORAL | 2 refills | Status: DC

## 2019-04-17 ENCOUNTER — Ambulatory Visit: Payer: BLUE CROSS/BLUE SHIELD | Admitting: Critical Care Medicine

## 2019-05-01 ENCOUNTER — Ambulatory Visit: Payer: BC Managed Care – PPO | Admitting: Cardiology

## 2019-06-04 ENCOUNTER — Other Ambulatory Visit: Payer: Self-pay

## 2019-06-04 DIAGNOSIS — I2510 Atherosclerotic heart disease of native coronary artery without angina pectoris: Secondary | ICD-10-CM

## 2019-06-04 MED ORDER — METOPROLOL TARTRATE 25 MG PO TABS: 12.5000 mg | ORAL_TABLET | Freq: Two times a day (BID) | ORAL | 0 refills | Status: DC

## 2019-06-09 DIAGNOSIS — S96912A Strain of unspecified muscle and tendon at ankle and foot level, left foot, initial encounter: Secondary | ICD-10-CM | POA: Diagnosis not present

## 2019-07-04 ENCOUNTER — Other Ambulatory Visit: Payer: Self-pay | Admitting: Cardiology

## 2019-07-04 DIAGNOSIS — I2510 Atherosclerotic heart disease of native coronary artery without angina pectoris: Secondary | ICD-10-CM

## 2019-07-04 DIAGNOSIS — K219 Gastro-esophageal reflux disease without esophagitis: Secondary | ICD-10-CM

## 2019-09-26 ENCOUNTER — Other Ambulatory Visit: Payer: Self-pay | Admitting: Cardiology

## 2019-09-26 DIAGNOSIS — K219 Gastro-esophageal reflux disease without esophagitis: Secondary | ICD-10-CM

## 2019-12-18 DIAGNOSIS — E785 Hyperlipidemia, unspecified: Secondary | ICD-10-CM | POA: Diagnosis not present

## 2019-12-18 DIAGNOSIS — Z0000 Encounter for general adult medical examination without abnormal findings: Secondary | ICD-10-CM | POA: Diagnosis not present

## 2019-12-18 DIAGNOSIS — I10 Essential (primary) hypertension: Secondary | ICD-10-CM | POA: Diagnosis not present

## 2019-12-18 DIAGNOSIS — Z125 Encounter for screening for malignant neoplasm of prostate: Secondary | ICD-10-CM | POA: Diagnosis not present

## 2019-12-18 DIAGNOSIS — R7303 Prediabetes: Secondary | ICD-10-CM | POA: Diagnosis not present

## 2019-12-18 DIAGNOSIS — I2510 Atherosclerotic heart disease of native coronary artery without angina pectoris: Secondary | ICD-10-CM | POA: Diagnosis not present

## 2020-01-15 ENCOUNTER — Other Ambulatory Visit: Payer: Self-pay

## 2020-01-15 MED ORDER — ATORVASTATIN CALCIUM 80 MG PO TABS: 80.0000 mg | ORAL_TABLET | Freq: Every day | ORAL | 0 refills | Status: DC

## 2020-01-23 ENCOUNTER — Encounter (HOSPITAL_COMMUNITY): Payer: Self-pay | Admitting: Surgery

## 2020-01-23 ENCOUNTER — Inpatient Hospital Stay (HOSPITAL_COMMUNITY)
Admission: EM | Admit: 2020-01-23 | Discharge: 2020-01-24 | DRG: 125 | Disposition: A | Discharge status: Other Acute Inpatient Hospital | Payer: BC Managed Care – PPO | Attending: Emergency Medicine | Admitting: Emergency Medicine

## 2020-01-23 ENCOUNTER — Inpatient Hospital Stay (HOSPITAL_COMMUNITY): Payer: BC Managed Care – PPO

## 2020-01-23 ENCOUNTER — Emergency Department (HOSPITAL_COMMUNITY): Payer: BC Managed Care – PPO

## 2020-01-23 ENCOUNTER — Other Ambulatory Visit: Payer: Self-pay

## 2020-01-23 DIAGNOSIS — D3132 Benign neoplasm of left choroid: Secondary | ICD-10-CM | POA: Diagnosis not present

## 2020-01-23 DIAGNOSIS — S0240DA Maxillary fracture, left side, initial encounter for closed fracture: Secondary | ICD-10-CM | POA: Diagnosis not present

## 2020-01-23 DIAGNOSIS — Z23 Encounter for immunization: Secondary | ICD-10-CM

## 2020-01-23 DIAGNOSIS — S01111A Laceration without foreign body of right eyelid and periocular area, initial encounter: Secondary | ICD-10-CM | POA: Diagnosis not present

## 2020-01-23 DIAGNOSIS — S42202A Unspecified fracture of upper end of left humerus, initial encounter for closed fracture: Secondary | ICD-10-CM | POA: Diagnosis not present

## 2020-01-23 DIAGNOSIS — W3400XA Accidental discharge from unspecified firearms or gun, initial encounter: Secondary | ICD-10-CM

## 2020-01-23 DIAGNOSIS — I10 Essential (primary) hypertension: Secondary | ICD-10-CM | POA: Diagnosis present

## 2020-01-23 DIAGNOSIS — M7989 Other specified soft tissue disorders: Secondary | ICD-10-CM | POA: Diagnosis not present

## 2020-01-23 DIAGNOSIS — W3301XA Accidental discharge of shotgun, initial encounter: Secondary | ICD-10-CM | POA: Diagnosis not present

## 2020-01-23 DIAGNOSIS — S01322A Laceration with foreign body of left ear, initial encounter: Secondary | ICD-10-CM | POA: Diagnosis present

## 2020-01-23 DIAGNOSIS — S01121A Laceration with foreign body of right eyelid and periocular area, initial encounter: Secondary | ICD-10-CM | POA: Diagnosis not present

## 2020-01-23 DIAGNOSIS — R7303 Prediabetes: Secondary | ICD-10-CM | POA: Diagnosis not present

## 2020-01-23 DIAGNOSIS — S42202B Unspecified fracture of upper end of left humerus, initial encounter for open fracture: Secondary | ICD-10-CM | POA: Diagnosis not present

## 2020-01-23 DIAGNOSIS — S21132A Puncture wound without foreign body of left front wall of thorax without penetration into thoracic cavity, initial encounter: Secondary | ICD-10-CM | POA: Diagnosis not present

## 2020-01-23 DIAGNOSIS — S42002A Fracture of unspecified part of left clavicle, initial encounter for closed fracture: Secondary | ICD-10-CM | POA: Diagnosis present

## 2020-01-23 DIAGNOSIS — I2699 Other pulmonary embolism without acute cor pulmonale: Secondary | ICD-10-CM | POA: Diagnosis not present

## 2020-01-23 DIAGNOSIS — I2510 Atherosclerotic heart disease of native coronary artery without angina pectoris: Secondary | ICD-10-CM | POA: Diagnosis not present

## 2020-01-23 DIAGNOSIS — S0121XA Laceration without foreign body of nose, initial encounter: Secondary | ICD-10-CM | POA: Diagnosis not present

## 2020-01-23 DIAGNOSIS — S42032A Displaced fracture of lateral end of left clavicle, initial encounter for closed fracture: Secondary | ICD-10-CM | POA: Diagnosis not present

## 2020-01-23 DIAGNOSIS — Z79899 Other long term (current) drug therapy: Secondary | ICD-10-CM

## 2020-01-23 DIAGNOSIS — K219 Gastro-esophageal reflux disease without esophagitis: Secondary | ICD-10-CM | POA: Diagnosis not present

## 2020-01-23 DIAGNOSIS — Z87891 Personal history of nicotine dependence: Secondary | ICD-10-CM

## 2020-01-23 DIAGNOSIS — Z20822 Contact with and (suspected) exposure to covid-19: Secondary | ICD-10-CM | POA: Diagnosis present

## 2020-01-23 DIAGNOSIS — S42352A Displaced comminuted fracture of shaft of humerus, left arm, initial encounter for closed fracture: Secondary | ICD-10-CM | POA: Diagnosis not present

## 2020-01-23 DIAGNOSIS — H5461 Unqualified visual loss, right eye, normal vision left eye: Secondary | ICD-10-CM | POA: Diagnosis not present

## 2020-01-23 DIAGNOSIS — S128XXA Fracture of other parts of neck, initial encounter: Secondary | ICD-10-CM | POA: Diagnosis not present

## 2020-01-23 DIAGNOSIS — S01332A Puncture wound without foreign body of left ear, initial encounter: Secondary | ICD-10-CM | POA: Diagnosis not present

## 2020-01-23 DIAGNOSIS — S42352B Displaced comminuted fracture of shaft of humerus, left arm, initial encounter for open fracture: Secondary | ICD-10-CM | POA: Diagnosis not present

## 2020-01-23 DIAGNOSIS — I6521 Occlusion and stenosis of right carotid artery: Secondary | ICD-10-CM | POA: Diagnosis not present

## 2020-01-23 DIAGNOSIS — S02609A Fracture of mandible, unspecified, initial encounter for closed fracture: Secondary | ICD-10-CM | POA: Diagnosis not present

## 2020-01-23 DIAGNOSIS — S42292A Other displaced fracture of upper end of left humerus, initial encounter for closed fracture: Secondary | ICD-10-CM | POA: Diagnosis not present

## 2020-01-23 DIAGNOSIS — S0521XA Ocular laceration and rupture with prolapse or loss of intraocular tissue, right eye, initial encounter: Secondary | ICD-10-CM | POA: Diagnosis not present

## 2020-01-23 DIAGNOSIS — S41042A Puncture wound with foreign body of left shoulder, initial encounter: Secondary | ICD-10-CM | POA: Diagnosis not present

## 2020-01-23 DIAGNOSIS — S02609B Fracture of mandible, unspecified, initial encounter for open fracture: Secondary | ICD-10-CM | POA: Diagnosis not present

## 2020-01-23 DIAGNOSIS — S01312A Laceration without foreign body of left ear, initial encounter: Secondary | ICD-10-CM | POA: Diagnosis not present

## 2020-01-23 DIAGNOSIS — R52 Pain, unspecified: Secondary | ICD-10-CM | POA: Diagnosis not present

## 2020-01-23 DIAGNOSIS — S0219XB Other fracture of base of skull, initial encounter for open fracture: Secondary | ICD-10-CM | POA: Diagnosis not present

## 2020-01-23 DIAGNOSIS — R58 Hemorrhage, not elsewhere classified: Secondary | ICD-10-CM | POA: Diagnosis not present

## 2020-01-23 DIAGNOSIS — S0531XA Ocular laceration without prolapse or loss of intraocular tissue, right eye, initial encounter: Secondary | ICD-10-CM | POA: Diagnosis not present

## 2020-01-23 DIAGNOSIS — S0292XA Unspecified fracture of facial bones, initial encounter for closed fracture: Secondary | ICD-10-CM | POA: Diagnosis not present

## 2020-01-23 DIAGNOSIS — R0602 Shortness of breath: Secondary | ICD-10-CM | POA: Diagnosis not present

## 2020-01-23 DIAGNOSIS — E785 Hyperlipidemia, unspecified: Secondary | ICD-10-CM | POA: Diagnosis not present

## 2020-01-23 DIAGNOSIS — S0285XB Fracture of orbit, unspecified, initial encounter for open fracture: Secondary | ICD-10-CM | POA: Diagnosis not present

## 2020-01-23 DIAGNOSIS — Z955 Presence of coronary angioplasty implant and graft: Secondary | ICD-10-CM | POA: Diagnosis not present

## 2020-01-23 DIAGNOSIS — S1193XA Puncture wound without foreign body of unspecified part of neck, initial encounter: Secondary | ICD-10-CM | POA: Diagnosis not present

## 2020-01-23 DIAGNOSIS — S42102A Fracture of unspecified part of scapula, left shoulder, initial encounter for closed fracture: Secondary | ICD-10-CM | POA: Diagnosis not present

## 2020-01-23 DIAGNOSIS — Y249XXA Unspecified firearm discharge, undetermined intent, initial encounter: Secondary | ICD-10-CM

## 2020-01-23 DIAGNOSIS — S058X1A Other injuries of right eye and orbit, initial encounter: Secondary | ICD-10-CM | POA: Diagnosis not present

## 2020-01-23 DIAGNOSIS — S02612A Fracture of condylar process of left mandible, initial encounter for closed fracture: Secondary | ICD-10-CM | POA: Diagnosis not present

## 2020-01-23 DIAGNOSIS — S42002B Fracture of unspecified part of left clavicle, initial encounter for open fracture: Secondary | ICD-10-CM | POA: Diagnosis not present

## 2020-01-23 DIAGNOSIS — Z043 Encounter for examination and observation following other accident: Secondary | ICD-10-CM | POA: Diagnosis not present

## 2020-01-23 DIAGNOSIS — S42192A Fracture of other part of scapula, left shoulder, initial encounter for closed fracture: Secondary | ICD-10-CM | POA: Diagnosis not present

## 2020-01-23 DIAGNOSIS — S0993XA Unspecified injury of face, initial encounter: Secondary | ICD-10-CM | POA: Diagnosis not present

## 2020-01-23 DIAGNOSIS — E871 Hypo-osmolality and hyponatremia: Secondary | ICD-10-CM | POA: Diagnosis not present

## 2020-01-23 DIAGNOSIS — S42302B Unspecified fracture of shaft of humerus, left arm, initial encounter for open fracture: Secondary | ICD-10-CM | POA: Diagnosis not present

## 2020-01-23 DIAGNOSIS — D62 Acute posthemorrhagic anemia: Secondary | ICD-10-CM | POA: Diagnosis not present

## 2020-01-23 DIAGNOSIS — K760 Fatty (change of) liver, not elsewhere classified: Secondary | ICD-10-CM | POA: Diagnosis not present

## 2020-01-23 HISTORY — DX: Atherosclerotic heart disease of native coronary artery without angina pectoris: I25.10

## 2020-01-23 LAB — I-STAT CHEM 8, ED
BUN: 14 mg/dL (ref 6–20)
Calcium, Ion: 1.02 mmol/L — ABNORMAL LOW (ref 1.15–1.40)
Chloride: 103 mmol/L (ref 98–111)
Creatinine, Ser: 1 mg/dL (ref 0.61–1.24)
Glucose, Bld: 162 mg/dL — ABNORMAL HIGH (ref 70–99)
HCT: 42 % (ref 39.0–52.0)
Hemoglobin: 14.3 g/dL (ref 13.0–17.0)
Potassium: 3.1 mmol/L — ABNORMAL LOW (ref 3.5–5.1)
Sodium: 138 mmol/L (ref 135–145)
TCO2: 22 mmol/L (ref 22–32)

## 2020-01-23 LAB — CBC
HCT: 43.4 % (ref 39.0–52.0)
Hemoglobin: 14.5 g/dL (ref 13.0–17.0)
MCH: 29.8 pg (ref 26.0–34.0)
MCHC: 33.4 g/dL (ref 30.0–36.0)
MCV: 89.1 fL (ref 80.0–100.0)
Platelets: 288 10*3/uL (ref 150–400)
RBC: 4.87 MIL/uL (ref 4.22–5.81)
RDW: 12.4 % (ref 11.5–15.5)
WBC: 20.3 10*3/uL — ABNORMAL HIGH (ref 4.0–10.5)
nRBC: 0 % (ref 0.0–0.2)

## 2020-01-23 LAB — COMPREHENSIVE METABOLIC PANEL
ALT: 50 U/L — ABNORMAL HIGH (ref 0–44)
AST: 36 U/L (ref 15–41)
Albumin: 3.6 g/dL (ref 3.5–5.0)
Alkaline Phosphatase: 71 U/L (ref 38–126)
Anion gap: 12 (ref 5–15)
BUN: 12 mg/dL (ref 6–20)
CO2: 22 mmol/L (ref 22–32)
Calcium: 8.2 mg/dL — ABNORMAL LOW (ref 8.9–10.3)
Chloride: 103 mmol/L (ref 98–111)
Creatinine, Ser: 1.25 mg/dL — ABNORMAL HIGH (ref 0.61–1.24)
GFR, Estimated: >60 mL/min (ref >60)
Glucose, Bld: 165 mg/dL — ABNORMAL HIGH (ref 70–99)
Potassium: 3.1 mmol/L — ABNORMAL LOW (ref 3.5–5.1)
Sodium: 137 mmol/L (ref 135–145)
Total Bilirubin: 1 mg/dL (ref 0.3–1.2)
Total Protein: 6.5 g/dL (ref 6.5–8.1)

## 2020-01-23 LAB — URINALYSIS, ROUTINE W REFLEX MICROSCOPIC
Bacteria, UA: NONE SEEN
Bilirubin Urine: NEGATIVE
Glucose, UA: NEGATIVE mg/dL
Ketones, ur: NEGATIVE mg/dL
Leukocytes,Ua: NEGATIVE
Nitrite: NEGATIVE
Protein, ur: 30 mg/dL — AB
Specific Gravity, Urine: 1.036 — ABNORMAL HIGH (ref 1.005–1.030)
pH: 5 (ref 5.0–8.0)

## 2020-01-23 LAB — RESP PANEL BY RT-PCR (FLU A&B, COVID) ARPGX2
Influenza A by PCR: NEGATIVE
Influenza B by PCR: NEGATIVE
SARS Coronavirus 2 by RT PCR: NEGATIVE

## 2020-01-23 LAB — PROTIME-INR
INR: 1.1 (ref 0.8–1.2)
Prothrombin Time: 13.3 seconds (ref 11.4–15.2)

## 2020-01-23 LAB — LACTIC ACID, PLASMA: Lactic Acid, Venous: 3.2 mmol/L (ref 0.5–1.9)

## 2020-01-23 LAB — SAMPLE TO BLOOD BANK

## 2020-01-23 LAB — ETHANOL: Alcohol, Ethyl (B): <10 mg/dL (ref <10)

## 2020-01-23 MED ORDER — TETANUS-DIPHTH-ACELL PERTUSSIS 5-2.5-18.5 LF-MCG/0.5 IM SUSY
0.5000 mL | PREFILLED_SYRINGE | Freq: Once | INTRAMUSCULAR | Status: AC
  Administered 2020-01-23: 0.5 mL via INTRAMUSCULAR

## 2020-01-23 MED ORDER — HYDROMORPHONE HCL 1 MG/ML IJ SOLN
1.0000 mg | Freq: Once | INTRAMUSCULAR | Status: AC
  Administered 2020-01-23: 1 mg via INTRAVENOUS

## 2020-01-23 MED ORDER — FENTANYL CITRATE (PF) 100 MCG/2ML IJ SOLN: 75.0000 ug | INTRAMUSCULAR | Status: DC | PRN

## 2020-01-23 MED ORDER — FENTANYL CITRATE (PF) 100 MCG/2ML IJ SOLN
INTRAMUSCULAR | Status: AC
  Administered 2020-01-23: 100 ug via INTRAVENOUS
  Filled 2020-01-23: qty 2

## 2020-01-23 MED ORDER — HYDROMORPHONE HCL 1 MG/ML IJ SOLN
1.0000 mg | Freq: Once | INTRAMUSCULAR | Status: AC
Start: 2020-01-23 — End: 2020-01-24
  Administered 2020-01-24: 1 mg via INTRAVENOUS
  Filled 2020-01-23: qty 1

## 2020-01-23 MED ORDER — HYDROMORPHONE HCL 1 MG/ML IJ SOLN
INTRAMUSCULAR | Status: AC
  Filled 2020-01-23: qty 1

## 2020-01-23 MED ORDER — LIDOCAINE-EPINEPHRINE 1 %-1:100000 IJ SOLN
20.0000 mL | Freq: Once | INTRAMUSCULAR | Status: AC
  Administered 2020-01-24: 20 mL
  Filled 2020-01-23: qty 1

## 2020-01-23 MED ORDER — IOHEXOL 350 MG/ML SOLN
100.0000 mL | Freq: Once | INTRAVENOUS | Status: AC | PRN
  Administered 2020-01-23: 100 mL via INTRAVENOUS

## 2020-01-23 MED ORDER — HYDROMORPHONE HCL 1 MG/ML IJ SOLN
1.0000 mg | Freq: Once | INTRAMUSCULAR | Status: AC
Start: 2020-01-23 — End: 2020-01-23

## 2020-01-23 MED ORDER — CEFAZOLIN SODIUM-DEXTROSE 2-4 GM/100ML-% IV SOLN
2.0000 g | Freq: Once | INTRAVENOUS | Status: AC
  Administered 2020-01-23: 2 g via INTRAVENOUS
  Filled 2020-01-23: qty 100

## 2020-01-23 MED ORDER — FENTANYL CITRATE (PF) 100 MCG/2ML IJ SOLN
50.0000 ug | Freq: Once | INTRAMUSCULAR | Status: AC
  Administered 2020-01-23: 50 ug via INTRAVENOUS
  Filled 2020-01-23: qty 2

## 2020-01-23 MED ORDER — HYDROMORPHONE HCL 1 MG/ML IJ SOLN
INTRAMUSCULAR | Status: AC
  Administered 2020-01-23: 1 mg via INTRAVENOUS
  Filled 2020-01-23: qty 1
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
## 2020-01-23 NOTE — Progress Notes (Signed)
   01/23/20 2030  Clinical Encounter Type  Visited With Patient not available  Visit Type Trauma  Referral From Nurse  Consult/Referral To Chaplain  Chaplain responded. The patient's family was not present. Chaplain advised unit secretary to call if Chaplain is needed. This note was prepared by Jeanine Luz, M.Div..  For questions please contact by phone [phone].

## 2020-01-23 NOTE — ED Notes (Signed)
Eye doctor at bedside.

## 2020-01-23 NOTE — ED Notes (Signed)
Wife and son at bedside    Dr. Lyndel Safe updated family on plan of care

## 2020-01-23 NOTE — ED Notes (Signed)
$  6063.01 cash given to pt's son Miichael per pt's request   Also wallet and set of keys given to son Legrand Como

## 2020-01-23 NOTE — ED Notes (Signed)
All pt's belongings given to Beattie with [facility]
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
## 2020-01-24 DIAGNOSIS — S0531XA Ocular laceration without prolapse or loss of intraocular tissue, right eye, initial encounter: Secondary | ICD-10-CM | POA: Diagnosis not present

## 2020-01-24 DIAGNOSIS — S0121XA Laceration without foreign body of nose, initial encounter: Secondary | ICD-10-CM | POA: Diagnosis not present

## 2020-01-24 DIAGNOSIS — E871 Hypo-osmolality and hyponatremia: Secondary | ICD-10-CM | POA: Diagnosis not present

## 2020-01-24 DIAGNOSIS — R079 Chest pain, unspecified: Secondary | ICD-10-CM | POA: Diagnosis not present

## 2020-01-24 DIAGNOSIS — S01332A Puncture wound without foreign body of left ear, initial encounter: Secondary | ICD-10-CM | POA: Diagnosis not present

## 2020-01-24 DIAGNOSIS — I2510 Atherosclerotic heart disease of native coronary artery without angina pectoris: Secondary | ICD-10-CM | POA: Diagnosis not present

## 2020-01-24 DIAGNOSIS — Z955 Presence of coronary angioplasty implant and graft: Secondary | ICD-10-CM | POA: Diagnosis not present

## 2020-01-24 DIAGNOSIS — S42202B Unspecified fracture of upper end of left humerus, initial encounter for open fracture: Secondary | ICD-10-CM | POA: Diagnosis not present

## 2020-01-24 DIAGNOSIS — S0521XA Ocular laceration and rupture with prolapse or loss of intraocular tissue, right eye, initial encounter: Secondary | ICD-10-CM | POA: Diagnosis not present

## 2020-01-24 DIAGNOSIS — S42032A Displaced fracture of lateral end of left clavicle, initial encounter for closed fracture: Secondary | ICD-10-CM | POA: Diagnosis not present

## 2020-01-24 DIAGNOSIS — S02609A Fracture of mandible, unspecified, initial encounter for closed fracture: Secondary | ICD-10-CM | POA: Diagnosis not present

## 2020-01-24 DIAGNOSIS — Z20822 Contact with and (suspected) exposure to covid-19: Secondary | ICD-10-CM | POA: Diagnosis not present

## 2020-01-24 DIAGNOSIS — K219 Gastro-esophageal reflux disease without esophagitis: Secondary | ICD-10-CM | POA: Diagnosis not present

## 2020-01-24 DIAGNOSIS — R52 Pain, unspecified: Secondary | ICD-10-CM | POA: Diagnosis not present

## 2020-01-24 DIAGNOSIS — I728 Aneurysm of other specified arteries: Secondary | ICD-10-CM | POA: Diagnosis not present

## 2020-01-24 DIAGNOSIS — S128XXA Fracture of other parts of neck, initial encounter: Secondary | ICD-10-CM | POA: Diagnosis not present

## 2020-01-24 DIAGNOSIS — E785 Hyperlipidemia, unspecified: Secondary | ICD-10-CM | POA: Diagnosis not present

## 2020-01-24 DIAGNOSIS — Z954 Presence of other heart-valve replacement: Secondary | ICD-10-CM | POA: Diagnosis not present

## 2020-01-24 DIAGNOSIS — S06820A Injury of left internal carotid artery, intracranial portion, not elsewhere classified without loss of consciousness, initial encounter: Secondary | ICD-10-CM | POA: Diagnosis not present

## 2020-01-24 DIAGNOSIS — W3400XA Accidental discharge from unspecified firearms or gun, initial encounter: Secondary | ICD-10-CM | POA: Diagnosis not present

## 2020-01-24 DIAGNOSIS — D62 Acute posthemorrhagic anemia: Secondary | ICD-10-CM | POA: Diagnosis not present

## 2020-01-24 DIAGNOSIS — S42002A Fracture of unspecified part of left clavicle, initial encounter for closed fracture: Secondary | ICD-10-CM | POA: Diagnosis not present

## 2020-01-24 DIAGNOSIS — S01111A Laceration without foreign body of right eyelid and periocular area, initial encounter: Secondary | ICD-10-CM | POA: Diagnosis not present

## 2020-01-24 DIAGNOSIS — I10 Essential (primary) hypertension: Secondary | ICD-10-CM | POA: Diagnosis not present

## 2020-01-24 DIAGNOSIS — Z7982 Long term (current) use of aspirin: Secondary | ICD-10-CM | POA: Diagnosis not present

## 2020-01-24 DIAGNOSIS — S42302B Unspecified fracture of shaft of humerus, left arm, initial encounter for open fracture: Secondary | ICD-10-CM | POA: Diagnosis not present

## 2020-01-24 DIAGNOSIS — S42102A Fracture of unspecified part of scapula, left shoulder, initial encounter for closed fracture: Secondary | ICD-10-CM | POA: Diagnosis not present

## 2020-01-24 DIAGNOSIS — S02632A Fracture of coronoid process of left mandible, initial encounter for closed fracture: Secondary | ICD-10-CM | POA: Diagnosis not present

## 2020-01-24 DIAGNOSIS — S41042A Puncture wound with foreign body of left shoulder, initial encounter: Secondary | ICD-10-CM | POA: Diagnosis not present

## 2020-01-24 NOTE — ED Notes (Signed)
Pt being sutured at this time by Dr. Lyndel Safe

## 2020-01-24 NOTE — ED Notes (Signed)
Called Games developer for [employer]

## 2020-01-24 NOTE — ED Notes (Signed)
Pt remains alert and oriented x's 3.  Requesting more pain meds  Rates pain 8 on scale of 0/10

## 2020-01-24 NOTE — ED Notes (Signed)
[facility] called to confirm they would transport patient. States they will arrive shortly.

## 2020-01-26 ENCOUNTER — Encounter: Payer: Self-pay | Admitting: Cardiology

## 2020-01-29 ENCOUNTER — Other Ambulatory Visit: Payer: Self-pay | Admitting: *Deleted

## 2020-01-29 ENCOUNTER — Other Ambulatory Visit: Payer: BC Managed Care – PPO

## 2020-01-29 DIAGNOSIS — Z20822 Contact with and (suspected) exposure to covid-19: Secondary | ICD-10-CM | POA: Diagnosis not present

## 2020-01-31 LAB — NOVEL CORONAVIRUS, NAA: SARS-CoV-2, NAA: NOT DETECTED

## 2020-01-31 LAB — SARS-COV-2, NAA 2 DAY TAT

## 2020-01-31 LAB — SPECIMEN STATUS REPORT

## 2020-02-02 DIAGNOSIS — S0521XD Ocular laceration and rupture with prolapse or loss of intraocular tissue, right eye, subsequent encounter: Secondary | ICD-10-CM | POA: Diagnosis not present

## 2020-02-02 DIAGNOSIS — S0531XD Ocular laceration without prolapse or loss of intraocular tissue, right eye, subsequent encounter: Secondary | ICD-10-CM | POA: Diagnosis not present

## 2020-02-02 DIAGNOSIS — H5461 Unqualified visual loss, right eye, normal vision left eye: Secondary | ICD-10-CM | POA: Diagnosis not present

## 2020-02-02 DIAGNOSIS — H5711 Ocular pain, right eye: Secondary | ICD-10-CM | POA: Diagnosis not present

## 2020-02-02 DIAGNOSIS — H3589 Other specified retinal disorders: Secondary | ICD-10-CM | POA: Diagnosis not present

## 2020-02-02 DIAGNOSIS — W3301XD Accidental discharge of shotgun, subsequent encounter: Secondary | ICD-10-CM | POA: Diagnosis not present

## 2020-02-02 DIAGNOSIS — H54415A Blindness right eye category 5, normal vision left eye: Secondary | ICD-10-CM | POA: Diagnosis not present

## 2020-02-02 DIAGNOSIS — H5440 Blindness, one eye, unspecified eye: Secondary | ICD-10-CM | POA: Diagnosis not present

## 2020-02-11 DIAGNOSIS — Z882 Allergy status to sulfonamides status: Secondary | ICD-10-CM | POA: Diagnosis not present

## 2020-02-11 DIAGNOSIS — X58XXXA Exposure to other specified factors, initial encounter: Secondary | ICD-10-CM | POA: Diagnosis not present

## 2020-02-11 DIAGNOSIS — S42252D Displaced fracture of greater tuberosity of left humerus, subsequent encounter for fracture with routine healing: Secondary | ICD-10-CM | POA: Diagnosis not present

## 2020-02-11 DIAGNOSIS — S42002B Fracture of unspecified part of left clavicle, initial encounter for open fracture: Secondary | ICD-10-CM | POA: Diagnosis not present

## 2020-02-11 DIAGNOSIS — S02632D Fracture of coronoid process of left mandible, subsequent encounter for fracture with routine healing: Secondary | ICD-10-CM | POA: Diagnosis not present

## 2020-02-11 DIAGNOSIS — W3400XA Accidental discharge from unspecified firearms or gun, initial encounter: Secondary | ICD-10-CM | POA: Diagnosis not present

## 2020-02-11 DIAGNOSIS — M25512 Pain in left shoulder: Secondary | ICD-10-CM | POA: Diagnosis not present

## 2020-02-11 DIAGNOSIS — Z880 Allergy status to penicillin: Secondary | ICD-10-CM | POA: Diagnosis not present

## 2020-02-11 DIAGNOSIS — S42022D Displaced fracture of shaft of left clavicle, subsequent encounter for fracture with routine healing: Secondary | ICD-10-CM | POA: Diagnosis not present

## 2020-02-11 DIAGNOSIS — S42112B Displaced fracture of body of scapula, left shoulder, initial encounter for open fracture: Secondary | ICD-10-CM | POA: Diagnosis not present

## 2020-02-11 DIAGNOSIS — S42112D Displaced fracture of body of scapula, left shoulder, subsequent encounter for fracture with routine healing: Secondary | ICD-10-CM | POA: Diagnosis not present

## 2020-02-11 DIAGNOSIS — S022XXD Fracture of nasal bones, subsequent encounter for fracture with routine healing: Secondary | ICD-10-CM | POA: Diagnosis not present

## 2020-02-11 DIAGNOSIS — S42102D Fracture of unspecified part of scapula, left shoulder, subsequent encounter for fracture with routine healing: Secondary | ICD-10-CM | POA: Diagnosis not present

## 2020-02-11 DIAGNOSIS — J3489 Other specified disorders of nose and nasal sinuses: Secondary | ICD-10-CM | POA: Diagnosis not present

## 2020-02-11 DIAGNOSIS — S42202B Unspecified fracture of upper end of left humerus, initial encounter for open fracture: Secondary | ICD-10-CM | POA: Diagnosis not present

## 2020-02-12 ENCOUNTER — Ambulatory Visit: Payer: BC Managed Care – PPO | Admitting: Cardiology

## 2020-03-10 DIAGNOSIS — S42112B Displaced fracture of body of scapula, left shoulder, initial encounter for open fracture: Secondary | ICD-10-CM | POA: Diagnosis not present

## 2020-03-10 DIAGNOSIS — S42112D Displaced fracture of body of scapula, left shoulder, subsequent encounter for fracture with routine healing: Secondary | ICD-10-CM | POA: Diagnosis not present

## 2020-03-10 DIAGNOSIS — S42002B Fracture of unspecified part of left clavicle, initial encounter for open fracture: Secondary | ICD-10-CM | POA: Diagnosis not present

## 2020-03-10 DIAGNOSIS — S42032D Displaced fracture of lateral end of left clavicle, subsequent encounter for fracture with routine healing: Secondary | ICD-10-CM | POA: Diagnosis not present

## 2020-03-10 DIAGNOSIS — S42202D Unspecified fracture of upper end of left humerus, subsequent encounter for fracture with routine healing: Secondary | ICD-10-CM | POA: Diagnosis not present

## 2020-03-10 DIAGNOSIS — S42202B Unspecified fracture of upper end of left humerus, initial encounter for open fracture: Secondary | ICD-10-CM | POA: Diagnosis not present

## 2020-03-10 DIAGNOSIS — W3400XA Accidental discharge from unspecified firearms or gun, initial encounter: Secondary | ICD-10-CM | POA: Diagnosis not present

## 2020-03-23 DIAGNOSIS — S42002B Fracture of unspecified part of left clavicle, initial encounter for open fracture: Secondary | ICD-10-CM | POA: Diagnosis not present

## 2020-03-23 DIAGNOSIS — W3400XA Accidental discharge from unspecified firearms or gun, initial encounter: Secondary | ICD-10-CM | POA: Diagnosis not present

## 2020-03-23 DIAGNOSIS — M25512 Pain in left shoulder: Secondary | ICD-10-CM | POA: Diagnosis not present

## 2020-03-23 DIAGNOSIS — M6281 Muscle weakness (generalized): Secondary | ICD-10-CM | POA: Diagnosis not present

## 2020-03-30 DIAGNOSIS — M6281 Muscle weakness (generalized): Secondary | ICD-10-CM | POA: Diagnosis not present

## 2020-03-30 DIAGNOSIS — S42002B Fracture of unspecified part of left clavicle, initial encounter for open fracture: Secondary | ICD-10-CM | POA: Diagnosis not present

## 2020-03-30 DIAGNOSIS — M25512 Pain in left shoulder: Secondary | ICD-10-CM | POA: Diagnosis not present

## 2020-03-30 DIAGNOSIS — W3400XA Accidental discharge from unspecified firearms or gun, initial encounter: Secondary | ICD-10-CM | POA: Diagnosis not present

## 2020-04-06 DIAGNOSIS — W3400XA Accidental discharge from unspecified firearms or gun, initial encounter: Secondary | ICD-10-CM | POA: Diagnosis not present

## 2020-04-06 DIAGNOSIS — S42002B Fracture of unspecified part of left clavicle, initial encounter for open fracture: Secondary | ICD-10-CM | POA: Diagnosis not present

## 2020-04-06 DIAGNOSIS — S42112B Displaced fracture of body of scapula, left shoulder, initial encounter for open fracture: Secondary | ICD-10-CM | POA: Diagnosis not present

## 2020-04-06 DIAGNOSIS — S42202B Unspecified fracture of upper end of left humerus, initial encounter for open fracture: Secondary | ICD-10-CM | POA: Diagnosis not present

## 2020-04-10 ENCOUNTER — Other Ambulatory Visit: Payer: Self-pay | Admitting: Cardiology

## 2020-04-10 DIAGNOSIS — I2510 Atherosclerotic heart disease of native coronary artery without angina pectoris: Secondary | ICD-10-CM

## 2020-04-12 ENCOUNTER — Other Ambulatory Visit: Payer: Self-pay

## 2020-04-12 DIAGNOSIS — I2510 Atherosclerotic heart disease of native coronary artery without angina pectoris: Secondary | ICD-10-CM

## 2020-04-12 MED ORDER — METOPROLOL TARTRATE 25 MG PO TABS: ORAL_TABLET | ORAL | 0 refills | Status: DC

## 2020-04-19 ENCOUNTER — Ambulatory Visit: Payer: BC Managed Care – PPO | Admitting: Cardiology

## 2020-04-27 ENCOUNTER — Other Ambulatory Visit: Payer: Self-pay

## 2020-04-27 ENCOUNTER — Encounter: Payer: Self-pay | Admitting: Cardiology

## 2020-04-27 ENCOUNTER — Ambulatory Visit: Payer: BC Managed Care – PPO | Admitting: Cardiology

## 2020-04-27 VITALS — BP 140/80 | HR 62 | Temp 98.7°F | Resp 17 | Ht 70.0 in | Wt 236.2 lb

## 2020-04-27 DIAGNOSIS — I2510 Atherosclerotic heart disease of native coronary artery without angina pectoris: Secondary | ICD-10-CM

## 2020-04-27 DIAGNOSIS — I10 Essential (primary) hypertension: Secondary | ICD-10-CM

## 2020-04-27 DIAGNOSIS — E782 Mixed hyperlipidemia: Secondary | ICD-10-CM

## 2020-04-27 MED ORDER — LOSARTAN POTASSIUM 50 MG PO TABS: 50.0000 mg | ORAL_TABLET | Freq: Every day | ORAL | 3 refills | Status: DC
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
## 2020-04-28 DIAGNOSIS — S42112D Displaced fracture of body of scapula, left shoulder, subsequent encounter for fracture with routine healing: Secondary | ICD-10-CM | POA: Diagnosis not present

## 2020-04-28 DIAGNOSIS — S42102D Fracture of unspecified part of scapula, left shoulder, subsequent encounter for fracture with routine healing: Secondary | ICD-10-CM | POA: Diagnosis not present

## 2020-04-28 DIAGNOSIS — S42002D Fracture of unspecified part of left clavicle, subsequent encounter for fracture with routine healing: Secondary | ICD-10-CM | POA: Diagnosis not present

## 2020-04-28 DIAGNOSIS — W3400XD Accidental discharge from unspecified firearms or gun, subsequent encounter: Secondary | ICD-10-CM | POA: Diagnosis not present

## 2020-04-28 DIAGNOSIS — S42202D Unspecified fracture of upper end of left humerus, subsequent encounter for fracture with routine healing: Secondary | ICD-10-CM | POA: Diagnosis not present

## 2020-05-03 ENCOUNTER — Other Ambulatory Visit: Payer: Self-pay | Admitting: Cardiology

## 2020-05-04 DIAGNOSIS — S42112B Displaced fracture of body of scapula, left shoulder, initial encounter for open fracture: Secondary | ICD-10-CM | POA: Diagnosis not present

## 2020-05-04 DIAGNOSIS — W3400XA Accidental discharge from unspecified firearms or gun, initial encounter: Secondary | ICD-10-CM | POA: Diagnosis not present

## 2020-05-04 DIAGNOSIS — S42202B Unspecified fracture of upper end of left humerus, initial encounter for open fracture: Secondary | ICD-10-CM | POA: Diagnosis not present

## 2020-05-04 DIAGNOSIS — S42002B Fracture of unspecified part of left clavicle, initial encounter for open fracture: Secondary | ICD-10-CM | POA: Diagnosis not present

## 2020-05-06 DIAGNOSIS — E782 Mixed hyperlipidemia: Secondary | ICD-10-CM | POA: Diagnosis not present

## 2020-05-06 DIAGNOSIS — I2510 Atherosclerotic heart disease of native coronary artery without angina pectoris: Secondary | ICD-10-CM | POA: Diagnosis not present

## 2020-05-07 LAB — BASIC METABOLIC PANEL
BUN/Creatinine Ratio: 14 (ref 9–20)
BUN: 12 mg/dL (ref 6–24)
CO2: 24 mmol/L (ref 20–29)
Calcium: 9.4 mg/dL (ref 8.7–10.2)
Chloride: 102 mmol/L (ref 96–106)
Creatinine, Ser: 0.87 mg/dL (ref 0.76–1.27)
Glucose: 112 mg/dL — ABNORMAL HIGH (ref 65–99)
Potassium: 4.9 mmol/L (ref 3.5–5.2)
Sodium: 142 mmol/L (ref 134–144)
eGFR: 101 mL/min/{1.73_m2} (ref >59)

## 2020-05-07 LAB — LIPID PANEL WITH LDL/HDL RATIO
Cholesterol, Total: 154 mg/dL (ref 100–199)
HDL: 31 mg/dL — ABNORMAL LOW (ref >39)
LDL Chol Calc (NIH): 96 mg/dL (ref 0–99)
LDL/HDL Ratio: 3.1 ratio (ref 0.0–3.6)
Triglycerides: 154 mg/dL — ABNORMAL HIGH (ref 0–149)
VLDL Cholesterol Cal: 27 mg/dL (ref 5–40)

## 2020-05-12 DIAGNOSIS — S4982XA Other specified injuries of left shoulder and upper arm, initial encounter: Secondary | ICD-10-CM | POA: Diagnosis not present

## 2020-05-12 DIAGNOSIS — S42002B Fracture of unspecified part of left clavicle, initial encounter for open fracture: Secondary | ICD-10-CM | POA: Diagnosis not present

## 2020-05-12 DIAGNOSIS — S42202B Unspecified fracture of upper end of left humerus, initial encounter for open fracture: Secondary | ICD-10-CM | POA: Diagnosis not present

## 2020-05-12 DIAGNOSIS — W3400XA Accidental discharge from unspecified firearms or gun, initial encounter: Secondary | ICD-10-CM | POA: Diagnosis not present

## 2020-05-12 DIAGNOSIS — M25512 Pain in left shoulder: Secondary | ICD-10-CM | POA: Diagnosis not present

## 2020-05-12 DIAGNOSIS — S42112B Displaced fracture of body of scapula, left shoulder, initial encounter for open fracture: Secondary | ICD-10-CM | POA: Diagnosis not present

## 2020-05-20 DIAGNOSIS — S21332D Puncture wound without foreign body of left front wall of thorax with penetration into thoracic cavity, subsequent encounter: Secondary | ICD-10-CM | POA: Diagnosis not present

## 2020-05-20 DIAGNOSIS — S42202D Unspecified fracture of upper end of left humerus, subsequent encounter for fracture with routine healing: Secondary | ICD-10-CM | POA: Diagnosis not present

## 2020-05-20 DIAGNOSIS — S42112D Displaced fracture of body of scapula, left shoulder, subsequent encounter for fracture with routine healing: Secondary | ICD-10-CM | POA: Diagnosis not present

## 2020-06-01 DIAGNOSIS — S42202B Unspecified fracture of upper end of left humerus, initial encounter for open fracture: Secondary | ICD-10-CM | POA: Diagnosis not present

## 2020-06-01 DIAGNOSIS — S42002B Fracture of unspecified part of left clavicle, initial encounter for open fracture: Secondary | ICD-10-CM | POA: Diagnosis not present

## 2020-06-01 DIAGNOSIS — S42112B Displaced fracture of body of scapula, left shoulder, initial encounter for open fracture: Secondary | ICD-10-CM | POA: Diagnosis not present

## 2020-06-01 DIAGNOSIS — W3400XA Accidental discharge from unspecified firearms or gun, initial encounter: Secondary | ICD-10-CM | POA: Diagnosis not present

## 2020-06-03 DIAGNOSIS — Z4420 Encounter for fitting and adjustment of artificial eye, unspecified: Secondary | ICD-10-CM | POA: Diagnosis not present

## 2020-06-10 DIAGNOSIS — Z9001 Acquired absence of eye: Secondary | ICD-10-CM | POA: Diagnosis not present

## 2020-06-10 DIAGNOSIS — Z9889 Other specified postprocedural states: Secondary | ICD-10-CM | POA: Diagnosis not present

## 2020-06-29 DIAGNOSIS — Z9889 Other specified postprocedural states: Secondary | ICD-10-CM | POA: Diagnosis not present

## 2020-06-29 DIAGNOSIS — T85398A Other mechanical complication of other ocular prosthetic devices, implants and grafts, initial encounter: Secondary | ICD-10-CM | POA: Diagnosis not present

## 2020-07-16 ENCOUNTER — Other Ambulatory Visit: Payer: Self-pay | Admitting: Cardiology

## 2020-07-16 DIAGNOSIS — K219 Gastro-esophageal reflux disease without esophagitis: Secondary | ICD-10-CM

## 2020-07-16 DIAGNOSIS — I2510 Atherosclerotic heart disease of native coronary artery without angina pectoris: Secondary | ICD-10-CM

## 2020-08-02 ENCOUNTER — Encounter: Payer: Self-pay | Admitting: Cardiology

## 2020-08-10 DIAGNOSIS — T868481 Other complications of corneal transplant, right eye: Secondary | ICD-10-CM | POA: Diagnosis not present

## 2020-08-10 DIAGNOSIS — Z9889 Other specified postprocedural states: Secondary | ICD-10-CM | POA: Diagnosis not present

## 2020-08-10 DIAGNOSIS — Y839 Surgical procedure, unspecified as the cause of abnormal reaction of the patient, or of later complication, without mention of misadventure at the time of the procedure: Secondary | ICD-10-CM | POA: Diagnosis not present

## 2020-08-10 DIAGNOSIS — H18891 Other specified disorders of cornea, right eye: Secondary | ICD-10-CM | POA: Diagnosis not present

## 2020-08-10 DIAGNOSIS — T85398A Other mechanical complication of other ocular prosthetic devices, implants and grafts, initial encounter: Secondary | ICD-10-CM | POA: Diagnosis not present

## 2020-08-16 ENCOUNTER — Other Ambulatory Visit: Payer: Self-pay | Admitting: Cardiology

## 2020-09-07 ENCOUNTER — Ambulatory Visit (INDEPENDENT_AMBULATORY_CARE_PROVIDER_SITE_OTHER): Payer: BC Managed Care – PPO | Admitting: Dermatology

## 2020-09-07 ENCOUNTER — Other Ambulatory Visit: Payer: Self-pay

## 2020-09-07 DIAGNOSIS — L918 Other hypertrophic disorders of the skin: Secondary | ICD-10-CM

## 2020-09-07 DIAGNOSIS — L821 Other seborrheic keratosis: Secondary | ICD-10-CM | POA: Diagnosis not present

## 2020-09-07 DIAGNOSIS — L738 Other specified follicular disorders: Secondary | ICD-10-CM

## 2020-09-07 DIAGNOSIS — L723 Sebaceous cyst: Secondary | ICD-10-CM | POA: Diagnosis not present

## 2020-09-07 DIAGNOSIS — Z1283 Encounter for screening for malignant neoplasm of skin: Secondary | ICD-10-CM

## 2020-09-07 DIAGNOSIS — L851 Acquired keratosis [keratoderma] palmaris et plantaris: Secondary | ICD-10-CM

## 2020-09-07 DIAGNOSIS — L858 Other specified epidermal thickening: Secondary | ICD-10-CM

## 2020-09-25 ENCOUNTER — Encounter: Payer: Self-pay | Admitting: Dermatology

## 2020-09-25 NOTE — Progress Notes (Signed)
   Follow-Up Visit   Subjective  Adam Calderon is a 60 y.o. male who presents for the following: Annual Exam (Here for skin exam. Concerns ears and skin tags under arms. Also check patients feet. ).  General skin examination, several places of concern Location:  Duration:  Quality:  Associated Signs/Symptoms: Modifying Factors:  Severity:  Timing: Context:   Objective  Well appearing patient in no apparent distress; mood and affect are within normal limits. Torso - Posterior (Back) General skin examination, no atypical pigmented lesions or nonmelanoma skin cancer  Left Malar Cheek, Mid Forehead Multiple 2 mm delled flesh-colored papules  Left Supraclavicular Area, Right Supraclavicular Area Fleshy, skin-colored 1 to 3 mm pedunculated papules.    Chest - Medial [facility]), Right Breast, Right Forearm - Posterior, Right Lower Leg - Posterior Several 3 to 6 mm brown textured flattopped papules  Left Dorsum of Foot, Right Dorsum of Foot 1 to 2 mm light textured verrucous papules    A full examination was performed including scalp, head, eyes, ears, nose, lips, neck, chest, axillae, abdomen, back, buttocks, bilateral upper extremities, bilateral lower extremities, hands, feet, fingers, toes, fingernails, and toenails. All findings within normal limits unless otherwise noted below.  Area beneath underwear not fully examined   Assessment & Plan    Skin exam for malignant neoplasm Torso - Posterior (Back)  Annual skin examination, encouraged to self examine twice annually.  Continue ultraviolet protection.  Sebaceous hyperplasia Mid Forehead; Left Malar Cheek  Benign okay to leave.  Told of similar appearance of early [place] so if there is growth or bleeding return for biopsy  Skin tag (2) Left Supraclavicular Area; Right Supraclavicular Area  Benign okay to leave unless patient wants removed.   Seborrheic keratosis (4) Right Forearm - Posterior; Right Lower Leg -  Posterior; Chest - Medial [facility]); Right Breast  Benign okay to leave if stable  Acrokeratosis (2) Left Dorsum of Foot; Right Dorsum of Foot  No intervention indicated.      I, Lavonna Monarch, MD, have reviewed all documentation for this visit.  The documentation on 09/25/20 for the exam, diagnosis, procedures, and orders are all accurate and complete.

## 2020-09-26 NOTE — Addendum Note (Signed)
Addended by: Lavonna Monarch on: 09/26/2020 07:06 AM   Modules accepted: Level of Service

## 2020-10-03 ENCOUNTER — Other Ambulatory Visit: Payer: Self-pay | Admitting: Cardiology

## 2020-10-03 DIAGNOSIS — I2510 Atherosclerotic heart disease of native coronary artery without angina pectoris: Secondary | ICD-10-CM

## 2020-10-07 ENCOUNTER — Other Ambulatory Visit: Payer: Self-pay | Admitting: Cardiology

## 2020-10-07 DIAGNOSIS — K219 Gastro-esophageal reflux disease without esophagitis: Secondary | ICD-10-CM

## 2020-11-30 DIAGNOSIS — Z9889 Other specified postprocedural states: Secondary | ICD-10-CM | POA: Diagnosis not present

## 2020-11-30 DIAGNOSIS — H04121 Dry eye syndrome of right lacrimal gland: Secondary | ICD-10-CM | POA: Diagnosis not present

## 2021-01-01 ENCOUNTER — Other Ambulatory Visit: Payer: Self-pay | Admitting: Cardiology

## 2021-01-01 DIAGNOSIS — I2510 Atherosclerotic heart disease of native coronary artery without angina pectoris: Secondary | ICD-10-CM

## 2021-01-01 DIAGNOSIS — K219 Gastro-esophageal reflux disease without esophagitis: Secondary | ICD-10-CM

## 2021-01-14 DIAGNOSIS — Z0000 Encounter for general adult medical examination without abnormal findings: Secondary | ICD-10-CM | POA: Diagnosis not present

## 2021-01-14 DIAGNOSIS — E7800 Pure hypercholesterolemia, unspecified: Secondary | ICD-10-CM | POA: Diagnosis not present

## 2021-01-14 DIAGNOSIS — I10 Essential (primary) hypertension: Secondary | ICD-10-CM | POA: Diagnosis not present

## 2021-01-14 DIAGNOSIS — R7309 Other abnormal glucose: Secondary | ICD-10-CM | POA: Diagnosis not present

## 2021-01-14 DIAGNOSIS — Z125 Encounter for screening for malignant neoplasm of prostate: Secondary | ICD-10-CM | POA: Diagnosis not present

## 2021-01-17 NOTE — Progress Notes (Signed)
Labs 01/14/2021:  Total cholesterol 146, triglycerides 148 HDL 32, LDL 88.  Non-HDL cholesterol 114.  A1c 6.0%.  Serum glucose 99 mg, BUN 13, creatinine 0.95, EGFR 93 mL, potassium 4.5, CMP otherwise normal.

## 2021-03-18 DIAGNOSIS — Z970 Presence of artificial eye: Secondary | ICD-10-CM | POA: Diagnosis not present

## 2021-04-01 ENCOUNTER — Other Ambulatory Visit: Payer: Self-pay | Admitting: Cardiology

## 2021-04-01 DIAGNOSIS — K219 Gastro-esophageal reflux disease without esophagitis: Secondary | ICD-10-CM

## 2021-04-01 DIAGNOSIS — I2510 Atherosclerotic heart disease of native coronary artery without angina pectoris: Secondary | ICD-10-CM

## 2021-04-27 ENCOUNTER — Ambulatory Visit: Payer: BC Managed Care – PPO | Admitting: Cardiology

## 2021-05-09 ENCOUNTER — Other Ambulatory Visit: Payer: Self-pay | Admitting: Cardiology

## 2021-05-09 DIAGNOSIS — I10 Essential (primary) hypertension: Secondary | ICD-10-CM

## 2021-05-10 ENCOUNTER — Encounter: Payer: Self-pay | Admitting: Cardiology

## 2021-05-10 ENCOUNTER — Ambulatory Visit: Payer: BC Managed Care – PPO | Admitting: Cardiology

## 2021-05-10 VITALS — BP 130/77 | HR 57 | Temp 97.1°F | Resp 14 | Ht 70.0 in | Wt 246.8 lb

## 2021-05-10 DIAGNOSIS — I10 Essential (primary) hypertension: Secondary | ICD-10-CM

## 2021-05-10 DIAGNOSIS — E782 Mixed hyperlipidemia: Secondary | ICD-10-CM

## 2021-05-10 DIAGNOSIS — I2510 Atherosclerotic heart disease of native coronary artery without angina pectoris: Secondary | ICD-10-CM | POA: Diagnosis not present

## 2021-05-10 MED ORDER — EZETIMIBE 10 MG PO TABS: 10.0000 mg | ORAL_TABLET | Freq: Every day | ORAL | 3 refills | Status: DC

## 2021-05-10 NOTE — Progress Notes (Signed)
? ?Primary Physician/Referring:  Lujean Amel, MD ? ?Patient ID: Adam Calderon, male    DOB: 1960-12-05, 61 y.o.   MRN: [id number] ? ?Chief Complaint  ?Patient presents with  ? Coronary Artery Disease  ? Hypertension  ? Follow-up  ?  1 year  ? ?HPI:   ? ?Adam Calderon  is a 61 y.o. male  with coronary artery disease and angioplasty to his LAD and right coronary artery in September 2013, hyperlipidemia, primary hypertension, hyperglycemia and mild obesity and remote history of cocaine, tobacco use, crack and marijuana abuse presents here for 1 year follow-up.  He is asymptomatic.  He has remained abstinent from tobacco and also has been clean with regard to any drug use for many years now.  ? ?He is asymptomatic, presents for annual visit. ? ?Past Medical History:  ?Diagnosis Date  ? Acid reflux   ? Coronary artery disease   ? Unstable angina pectoris status post multiple stents 11/22/2011  ? ?Past Surgical History:  ?Procedure Laterality Date  ? ANEURYSM COILING    ? CORONARY ANGIOPLASTY WITH STENT PLACEMENT    ? HERNIA REPAIR    ? LEFT HEART CATHETERIZATION WITH CORONARY ANGIOGRAM N/A 11/22/2011  ? Procedure: LEFT HEART CATHETERIZATION WITH CORONARY ANGIOGRAM;  Surgeon: Laverda Page, MD;  Location: [facility] CATH LAB;  Service: Cardiovascular;  Laterality: N/A;  ? ?Social History  ? ?Tobacco Use  ? Smoking status: Former  ?  Packs/day: 1.50  ?  Years: 20.00  ?  Pack years: 30.00  ?  Types: Cigarettes  ?  Quit date: 07/31/2005  ?  Years since quitting: 15.7  ? Smokeless tobacco: Never  ?Substance Use Topics  ? Alcohol use: Not Currently  ?  Comment: recovering alcoholic  ? ? ?ROS  ?Review of Systems  ?Cardiovascular:  Negative for chest pain, dyspnea on exertion and leg swelling.  ?Gastrointestinal:  Negative for melena.  ?Objective  ?Blood pressure 130/77, pulse (!) 57, temperature (!) 97.1 ?F (36.2 ?C), temperature source Temporal, resp. rate 14, height _0  (1.778 m), weight 246 lb 12.8 oz (111.9 kg), SpO2 97 %.   ? ?  05/10/2021  ?  3:39 PM 04/27/2020  ?  4:08 PM 01/24/2020  ?  1:53 AM  ?Vitals with BMI  ?Height _1  _2    ?Weight 246 lbs 13 oz 236 lbs 3 oz   ?BMI 35.41 33.89   ?Systolic 179 150 569  ?Diastolic 77 80 68  ?Pulse 57 62 77  ?  ? Physical Exam ?Constitutional:   ?   Comments: Mildly obese, muscular  ?Neck:  ?   Thyroid: No thyromegaly.  ?   Vascular: No JVD.  ?Cardiovascular:  ?   Rate and Rhythm: Normal rate and regular rhythm.  ?   Pulses: Intact distal pulses.  ?   Heart sounds: Normal heart sounds. No murmur heard. ?  No gallop.  ?Pulmonary:  ?   Effort: Pulmonary effort is normal.  ?   Breath sounds: Normal breath sounds.  ?Abdominal:  ?   General: Bowel sounds are normal.  ?   Palpations: Abdomen is soft.  ?Musculoskeletal:  ?   Cervical back: Neck supple.  ?   Right lower leg: No edema.  ?   Left lower leg: No edema.  ? ?Laboratory examination:  ? ? ?  Latest Ref Rng & Units 05/06/2020  ? 10:02 AM 01/23/2020  ?  8:42 PM 01/23/2020  ?  8:35 PM  ?CMP  ?Glucose 65 -  99 mg/dL 112   162   165    ?BUN 6 - 24 mg/dL _0 ?Creatinine 0.76 - 1.27 mg/dL 0.87   1.00   1.25    ?Sodium 134 - 144 mmol/L 142   138   137    ?Potassium 3.5 - 5.2 mmol/L 4.9   3.1   3.1    ?Chloride 96 - 106 mmol/L 102   103   103    ?CO2 20 - 29 mmol/L 24    22    ?Calcium 8.7 - 10.2 mg/dL 9.4    8.2    ?Total Protein 6.5 - 8.1 g/dL   6.5    ?Total Bilirubin 0.3 - 1.2 mg/dL   1.0    ?Alkaline Phos 38 - 126 U/L   71    ?AST 15 - 41 U/L   36    ?ALT 0 - 44 U/L   50    ? ? ?  Latest Ref Rng & Units 01/23/2020  ?  8:42 PM 01/23/2020  ?  8:35 PM 11/23/2011  ?  2:15 AM  ?CBC  ?WBC 4.0 - 10.5 K/uL  20.3   7.9    ?Hemoglobin 13.0 - 17.0 g/dL 14.3   14.5   15.2    ?Hematocrit 39.0 - 52.0 % 42.0   43.4   42.5    ?Platelets 150 - 400 K/uL  288   235    ? ?Lab Results  ?Component Value Date  ? CHOL 154 05/06/2020  ? HDL 31 (L) 05/06/2020  ? [place] 96 05/06/2020  ? TRIG 154 (H) 05/06/2020  ? CHOLHDL 6.8 11/22/2011  ?  ?Labs  01/14/2021: ? ?A1c 6.0%. ? ?Serum glucose 99 mg, BUN 13, creatinine 0.95, EGFR 93 mL, potassium 4.5, LFTs normal. ? ?Total cholesterol 146, triglycerides 148, HDL 32, LDL 88.  Non-HDL cholesterol 114. ?Medications and allergies  ? ?Allergies  ?Allergen Reactions  ? Amoxapine And Related Itching and Rash  ? Amoxicillin Rash  ?  ? ?Current Outpatient Medications:  ?  aspirin EC 81 MG tablet, Take 1 tablet (81 mg total) by mouth daily., Disp: , Rfl:  ?  atorvastatin (LIPITOR) 80 MG tablet, Take 1 tablet by mouth once daily, Disp: 90 tablet, Rfl: 3 ?  cholecalciferol (VITAMIN D) 25 MCG (1000 UNIT) tablet, Take 1 tablet by mouth daily., Disp: , Rfl:  ?  ezetimibe (ZETIA) 10 MG tablet, Take 1 tablet (10 mg total) by mouth daily., Disp: 90 tablet, Rfl: 3 ?  Fish Oil-Cholecalciferol (FISH OIL + D3 PO), Take 1 capsule by mouth daily., Disp: , Rfl:  ?  losartan (COZAAR) 50 MG tablet, Take 1 tablet by mouth once daily, Disp: 10 tablet, Rfl: 0 ?  metoprolol tartrate (LOPRESSOR) 25 MG tablet, Take 1/2 (one-half) tablet by mouth twice daily, Disp: 90 tablet, Rfl: 0 ?  pantoprazole (PROTONIX) 40 MG tablet, TAKE 1 TABLET BY MOUTH ONCE DAILY AS NEEDED IF HEART BURN IS NOT RELIEVED WITH PEPCID, Disp: 90 tablet, Rfl: 0 ?  ZINC GLUCONATE PO, Take 25 mg by mouth daily., Disp: , Rfl:    ?Radiology:  ?NA ? ?Cardiac Studies:  ? ?Left Heart Catheterization 11/22/11:  ?PTCA and stenting of the Mid LAD with 3.5 x 23 mm Xience xpedition stent and proximal RCA with implantation of a 4.0 x 23 mm Xience epedition stent. ?Mid segment of circumflex shows a 50-60%  stenosis and a small OM-1 shows  a 70% stenosis. ? ?Echocardiogram 06/20/2017: ?Left ventricle cavity is normal in size. Mild to moderate concentric hypertrophy of the left ventricle. Normal global wall motion. Normal diastolic filling pattern. Calculated EF 55%. ?Left atrial cavity is moderately dilated at 4.7 cm. ?Right atrial cavity is mildly dilated. ?Mild (Grade I) mitral  regurgitation. ?Mild tricuspid regurgitation. Mild pulmonary hypertension. ?IVC is dilated with respiratory variation. Suggests elevated central venous pressure. No significant change from  07/28/2015. ? ?Exercise Treadmill Stress Test 05/21/2017:  ?Indication: Chest pain, SOB ?The patient exercised on Bruce protocol for  09:22 min. Patient achieved  10.76 METS and reached HR  146 bpm, which is   87% of maximum age-predicted HR.  Stress test terminated due to SOB. ? ?Exercise capacity was normal. ? ?HR Response to Exercise: Appropriate. ?BP Response to Exercise: Normal resting BP- appropriate response. ?Chest Pain: non-limiting. Arrhythmias: none. ? ?ST Changes:  While interpretation is limited due to significat artifact, no overt ischemic changes seen at peak exercise.  ? ?Impression:  Normal exercise treadmill stress test. If clinical suspicion is high, consider nuclear sress test given known h/o CAD. ? ?EKG: ?EKG 05/10/2021: Normal sinus rhythm at rate of 59 bpm, normal EKG. No change from 04/27/2020.  ? ?Assessment  ? ?  ICD-10-CM   ?1. Coronary artery disease involving native coronary artery of native heart without angina pectoris  I25.10 EKG 12-Lead  ?  ?2. Mixed hyperlipidemia  E78.2 ezetimibe (ZETIA) 10 MG tablet  ?  Lipid Panel With LDL/HDL Ratio  ?  ?3. Primary hypertension  I10   ?  ?  ?EKG 03/06/2019: Sinus bradycardia at rate of 59 bpm, normal axis.  Poor R wave progression, probably normal variant.  No evidence of ischemia, normal QT interval.  EKG 08/01/2018: Marked sinus bradycardia at rate of 44 bpm.  ? ?Meds ordered this encounter  ?Medications  ? ezetimibe (ZETIA) 10 MG tablet  ?  Sig: Take 1 tablet (10 mg total) by mouth daily.  ?  Dispense:  90 tablet  ?  Refill:  3  ?  ?Medications Discontinued During This Encounter  ?Medication Reason  ? TOBRADEX ophthalmic ointment   ?  ? ?Recommendations:  ? ?Adam Calderon  is a 61 y.o. male  with coronary artery disease and angioplasty to his LAD and right  coronary artery in September 2013, hyperlipidemia, primary hypertension, hyperglycemia and mild obesity and remote history of cocaine, tobacco use, crack and marijuana abuse presents here for 1 year follow-up.  He is asymptomati

## 2021-05-20 ENCOUNTER — Telehealth: Payer: Self-pay | Admitting: Student

## 2021-05-20 NOTE — Telephone Encounter (Signed)
Patient called office with concerns of new onset intermittent chest pain over the last 1 hour.  Reports that this is nonexertional lasting only several seconds and localized to the right upper chest region. ? ?Advised patient this is likely noncardiac, recommended he try Tylenol, suspect musculoskeletal etiology.  Advised patient regarding signs and symptoms that would warrant urgent or emergent evaluation, he verbalized understanding agreement. ? ?We will notify our office if symptoms worsen or fail to improve. ?

## 2021-05-29 ENCOUNTER — Other Ambulatory Visit: Payer: Self-pay | Admitting: Cardiology

## 2021-05-29 DIAGNOSIS — I10 Essential (primary) hypertension: Secondary | ICD-10-CM

## 2021-05-30 ENCOUNTER — Other Ambulatory Visit: Payer: Self-pay

## 2021-05-30 DIAGNOSIS — I10 Essential (primary) hypertension: Secondary | ICD-10-CM

## 2021-05-30 MED ORDER — LOSARTAN POTASSIUM 50 MG PO TABS: 50.0000 mg | ORAL_TABLET | Freq: Every day | ORAL | 0 refills | Status: DC
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
## 2021-08-13 ENCOUNTER — Other Ambulatory Visit: Payer: Self-pay | Admitting: Cardiology

## 2021-08-13 DIAGNOSIS — I2510 Atherosclerotic heart disease of native coronary artery without angina pectoris: Secondary | ICD-10-CM

## 2021-08-13 DIAGNOSIS — I10 Essential (primary) hypertension: Secondary | ICD-10-CM

## 2021-09-07 ENCOUNTER — Ambulatory Visit: Payer: BC Managed Care – PPO | Admitting: Dermatology

## 2021-09-10 ENCOUNTER — Other Ambulatory Visit: Payer: Self-pay | Admitting: Cardiology

## 2021-09-10 DIAGNOSIS — K219 Gastro-esophageal reflux disease without esophagitis: Secondary | ICD-10-CM

## 2021-10-13 DIAGNOSIS — Z4420 Encounter for fitting and adjustment of artificial eye, unspecified: Secondary | ICD-10-CM | POA: Diagnosis not present

## 2021-11-20 ENCOUNTER — Other Ambulatory Visit: Payer: Self-pay | Admitting: Cardiology

## 2021-11-20 DIAGNOSIS — I2510 Atherosclerotic heart disease of native coronary artery without angina pectoris: Secondary | ICD-10-CM

## 2021-12-07 ENCOUNTER — Other Ambulatory Visit: Payer: Self-pay | Admitting: Cardiology

## 2021-12-07 DIAGNOSIS — K219 Gastro-esophageal reflux disease without esophagitis: Secondary | ICD-10-CM

## 2021-12-18 ENCOUNTER — Other Ambulatory Visit: Payer: Self-pay | Admitting: Cardiology

## 2021-12-18 DIAGNOSIS — I10 Essential (primary) hypertension: Secondary | ICD-10-CM

## 2021-12-18 DIAGNOSIS — K219 Gastro-esophageal reflux disease without esophagitis: Secondary | ICD-10-CM

## 2021-12-20 ENCOUNTER — Other Ambulatory Visit: Payer: Self-pay | Admitting: Cardiology

## 2022-02-09 DIAGNOSIS — R7303 Prediabetes: Secondary | ICD-10-CM | POA: Diagnosis not present

## 2022-02-09 DIAGNOSIS — Z125 Encounter for screening for malignant neoplasm of prostate: Secondary | ICD-10-CM | POA: Diagnosis not present

## 2022-02-09 DIAGNOSIS — E7800 Pure hypercholesterolemia, unspecified: Secondary | ICD-10-CM | POA: Diagnosis not present

## 2022-02-09 DIAGNOSIS — Z0000 Encounter for general adult medical examination without abnormal findings: Secondary | ICD-10-CM | POA: Diagnosis not present

## 2022-02-09 DIAGNOSIS — I10 Essential (primary) hypertension: Secondary | ICD-10-CM | POA: Diagnosis not present

## 2022-02-09 DIAGNOSIS — Z1329 Encounter for screening for other suspected endocrine disorder: Secondary | ICD-10-CM | POA: Diagnosis not present

## 2022-02-16 ENCOUNTER — Other Ambulatory Visit: Payer: Self-pay | Admitting: Cardiology

## 2022-02-16 DIAGNOSIS — I2510 Atherosclerotic heart disease of native coronary artery without angina pectoris: Secondary | ICD-10-CM

## 2022-05-04 DIAGNOSIS — Z4420 Encounter for fitting and adjustment of artificial eye, unspecified: Secondary | ICD-10-CM | POA: Diagnosis not present

## 2022-05-11 ENCOUNTER — Encounter: Payer: Self-pay | Admitting: Cardiology

## 2022-05-11 ENCOUNTER — Ambulatory Visit: Payer: BC Managed Care – PPO | Admitting: Cardiology

## 2022-05-11 VITALS — BP 150/75 | HR 58 | Ht 70.0 in | Wt 248.0 lb

## 2022-05-11 DIAGNOSIS — I10 Essential (primary) hypertension: Secondary | ICD-10-CM

## 2022-05-11 DIAGNOSIS — E782 Mixed hyperlipidemia: Secondary | ICD-10-CM

## 2022-05-11 DIAGNOSIS — I2510 Atherosclerotic heart disease of native coronary artery without angina pectoris: Secondary | ICD-10-CM | POA: Diagnosis not present

## 2022-05-11 MED ORDER — OLMESARTAN MEDOXOMIL-HCTZ 40-12.5 MG PO TABS: 1.0000 | ORAL_TABLET | ORAL | 3 refills | Status: DC

## 2022-05-11 MED ORDER — EZETIMIBE 10 MG PO TABS
10.0000 mg | ORAL_TABLET | Freq: Every day | ORAL | 3 refills | Status: DC
Start: 2022-05-11 — End: 2023-05-10

## 2022-05-11 NOTE — Progress Notes (Addendum)
Primary Physician/Referring:  Darrow Bussing, MD  Patient ID: Adam Calderon, male    DOB: 1960-06-25, 62 y.o.   MRN: [id number]  Chief Complaint  Patient presents with   Coronary Artery Disease   Hyperlipidemia   Follow-up    1 year   HPI:    Adam Calderon  is a 62 y.o. male  with coronary artery disease and angioplasty to his LAD and right coronary artery in September 2013, hyperlipidemia, primary hypertension, hyperglycemia and mild obesity and remote history of cocaine, tobacco use, crack and marijuana abuse presents here for 1 year follow-up.  He is asymptomatic.  He has remained abstinent from tobacco and also has been clean with regard to any drug use for many years now.   He is asymptomatic, presents for annual visit.  Remains asymptomatic.  Past Medical History:  Diagnosis Date   Acid reflux    Coronary artery disease    Unstable angina pectoris status post multiple stents 11/22/2011   Past Surgical History:  Procedure Laterality Date   ANEURYSM COILING     CORONARY ANGIOPLASTY WITH STENT PLACEMENT     HERNIA REPAIR     LEFT HEART CATHETERIZATION WITH CORONARY ANGIOGRAM N/A 11/22/2011   Procedure: LEFT HEART CATHETERIZATION WITH CORONARY ANGIOGRAM;  Surgeon: Pamella Pert, MD;  Location: [facility] CATH LAB;  Service: Cardiovascular;  Laterality: N/A;   Social History   Tobacco Use   Smoking status: Former    Current packs/day: 0.00    Average packs/day: 1.5 packs/day for 20.0 years (30.0 ttl pk-yrs)    Types: Cigarettes    Start date: 07/31/1985    Quit date: 07/31/2005    Years since quitting: 17.1   Smokeless tobacco: Never  Substance Use Topics   Alcohol use: Not Currently    Comment: recovering alcoholic    ROS  Review of Systems  Cardiovascular:  Negative for chest pain, dyspnea on exertion and leg swelling.  Gastrointestinal:  Negative for melena.   Objective  Blood pressure (!) 150/75, pulse (!) 58, height 5\' 10"  (1.778 m), weight 248 lb (112.5 kg),  SpO2 98%.     09/05/2022    9:10 AM 05/11/2022    9:10 AM 05/10/2021    3:39 PM  Vitals with BMI  Height 5\' 10"  5\' 10"  5\' 10"   Weight 245 lbs 248 lbs 246 lbs 13 oz  BMI 35.15 35.58 35.41  Systolic 119 150 213  Diastolic 74 75 77  Pulse 63 58 57     Physical Exam Constitutional:      Comments: Mildly obese, muscular  Neck:     Thyroid: No thyromegaly.     Vascular: No JVD.  Cardiovascular:     Rate and Rhythm: Normal rate and regular rhythm.     Pulses: Intact distal pulses.     Heart sounds: Normal heart sounds. No murmur heard.    No gallop.  Pulmonary:     Effort: Pulmonary effort is normal.     Breath sounds: Normal breath sounds.  Abdominal:     General: Bowel sounds are normal.     Palpations: Abdomen is soft.  Musculoskeletal:     Cervical back: Neck supple.     Right lower leg: No edema.     Left lower leg: No edema.    Laboratory examination:      Latest Ref Rng & Units 09/04/2022    2:21 PM 05/06/2020   10:02 AM 01/23/2020    8:42 PM  CMP  Glucose  70 - 99 mg/dL 96  308  657   BUN 6 - 24 mg/dL 10  12  14    Creatinine 0.76 - 1.27 mg/dL 8.46  9.62  9.52   Sodium 134 - 144 mmol/L 141  142  138   Potassium 3.5 - 5.2 mmol/L 4.9  4.9  3.1   Chloride 96 - 106 mmol/L 98  102  103   CO2 20 - 29 mmol/L 26  24    Calcium 8.7 - 10.2 mg/dL 9.5  9.4        Latest Ref Rng & Units 01/23/2020    8:42 PM 01/23/2020    8:35 PM 11/23/2011    2:15 AM  CBC  WBC 4.0 - 10.5 K/uL  20.3  7.9   Hemoglobin 13.0 - 17.0 g/dL 84.1  32.4  40.1   Hematocrit 39.0 - 52.0 % 42.0  43.4  42.5   Platelets 150 - 400 K/uL  288  235    Lab Results  Component Value Date   CHOL 150 09/04/2022   HDL 34 (L) 09/04/2022   LDLCALC 90 09/04/2022   TRIG 146 09/04/2022   CHOLHDL 6.8 11/22/2011    Lipoprotein (a) 09/04/2022 <75.0 nmol/L 10.1   External Labs:  Cholesterol, total 161.000 m 02/09/2022 HDL 31.000 mg 02/09/2022 LDL 96.000 mg 02/09/2022 Triglycerides 195.000 m 02/09/2022  A1C 6.000  % 02/09/2022 TSH 1.330 02/09/2022  Hemoglobin 14.300 g/d 01/23/2020  Creatinine, Serum 1.050 mg/ 02/09/2022 CrCl Est 78.21 02/09/2022 eGFR 82.000 calc 02/09/2022 Potassium 4.900 mm 02/09/2022 ALT (SGPT) 40.000 U/L 02/09/2022  Radiology:  NA  Cardiac Studies:   Left Heart Catheterization 11/22/11:  PTCA and stenting of the Mid LAD with 3.5 x 23 mm Xience xpedition stent and proximal RCA with implantation of a 4.0 x 23 mm Xience epedition stent. Mid segment of circumflex shows a 50-60%  stenosis and a small OM-1 shows a 70% stenosis.  Echocardiogram 06/20/2017: Left ventricle cavity is normal in size. Mild to moderate concentric hypertrophy of the left ventricle. Normal global wall motion. Normal diastolic filling pattern. Calculated EF 55%. Left atrial cavity is moderately dilated at 4.7 cm. Right atrial cavity is mildly dilated. Mild (Grade I) mitral regurgitation. Mild tricuspid regurgitation. Mild pulmonary hypertension. IVC is dilated with respiratory variation. Suggests elevated central venous pressure. No significant change from  07/28/2015.  Exercise Treadmill Stress Test 05/21/2017:  Indication: Chest pain, SOB The patient exercised on Bruce protocol for  09:22 min. Patient achieved  10.76 METS and reached HR  146 bpm, which is   87% of maximum age-predicted HR.  Stress test terminated due to SOB.  Exercise capacity was normal.  HR Response to Exercise: Appropriate. BP Response to Exercise: Normal resting BP- appropriate response. Chest Pain: non-limiting. Arrhythmias: none.  ST Changes:  While interpretation is limited due to significat artifact, no overt ischemic changes seen at peak exercise.   Impression:  Normal exercise treadmill stress test. If clinical suspicion is high, consider nuclear sress test given known h/o CAD.  EKG:  EKG 05/11/2022: Normal sinus rhythm/sinus bradycardia at rate of 50 bpm, otherwise normal EKG.  No change from 05/10/2021.   Medications and  allergies   Allergies  Allergen Reactions   Amoxapine And Related Itching and Rash   Amoxicillin Rash     Current Outpatient Medications:    aspirin EC 81 MG tablet, Take 1 tablet (81 mg total) by mouth daily., Disp: , Rfl:    cholecalciferol (VITAMIN D) 25 MCG (1000  UNIT) tablet, Take 1 tablet by mouth daily., Disp: , Rfl:    cyanocobalamin (VITAMIN B12) 1000 MCG tablet, Take 1,000 mcg by mouth daily., Disp: , Rfl:    ezetimibe (ZETIA) 10 MG tablet, Take 1 tablet (10 mg total) by mouth daily., Disp: 90 tablet, Rfl: 3   Fish Oil-Cholecalciferol (FISH OIL + D3 PO), Take 1 capsule by mouth daily., Disp: , Rfl:    olmesartan-hydrochlorothiazide (BENICAR HCT) 40-12.5 MG tablet, Take 1 tablet by mouth every morning., Disp: 90 tablet, Rfl: 3   pantoprazole (PROTONIX) 40 MG tablet, TAKE 1 TABLET BY MOUTH ONCE DAILY AS NEEDED IF HEARTBURN IS NOT RELIEVED WITH PEPCID, Disp: 90 tablet, Rfl: 1   ZINC GLUCONATE PO, Take 25 mg by mouth daily., Disp: , Rfl:    atorvastatin (LIPITOR) 80 MG tablet, Take 1 tablet by mouth once daily, Disp: 90 tablet, Rfl: 0   metoprolol tartrate (LOPRESSOR) 25 MG tablet, Take 1/2 (one-half) tablet by mouth twice daily, Disp: 90 tablet, Rfl: 3   Assessment     ICD-10-CM   1. Coronary artery disease involving native coronary artery of native heart without angina pectoris  I25.10 EKG 12-Lead    Lipid Panel With LDL/HDL Ratio    2. Mixed hyperlipidemia  E78.2 ezetimibe (ZETIA) 10 MG tablet    Lipid Panel With LDL/HDL Ratio    Lipoprotein A (LPA)    Lipid Panel With LDL/HDL Ratio    3. Primary hypertension  I10 olmesartan-hydrochlorothiazide (BENICAR HCT) 40-12.5 MG tablet    Basic metabolic panel      EKG 03/06/2019: Sinus bradycardia at rate of 59 bpm, normal axis.  Poor R wave progression, probably normal variant.  No evidence of ischemia, normal QT interval.  EKG 08/01/2018: Marked sinus bradycardia at rate of 44 bpm.   Meds ordered this encounter  Medications    olmesartan-hydrochlorothiazide (BENICAR HCT) 40-12.5 MG tablet    Sig: Take 1 tablet by mouth every morning.    Dispense:  90 tablet    Refill:  3   ezetimibe (ZETIA) 10 MG tablet    Sig: Take 1 tablet (10 mg total) by mouth daily.    Dispense:  90 tablet    Refill:  3    Medications Discontinued During This Encounter  Medication Reason   ezetimibe (ZETIA) 10 MG tablet    losartan (COZAAR) 50 MG tablet Discontinued by provider     Recommendations:   Adam Calderon  is a 62 y.o. male  with coronary artery disease and angioplasty to his LAD and right coronary artery in September 2013, hyperlipidemia, primary hypertension, hyperglycemia and mild obesity and remote history of cocaine, tobacco use, crack and marijuana abuse presents here for 1 year follow-up.    1. Coronary artery disease involving native coronary artery of native heart without angina pectoris Patient remains asymptomatic without angina pectoris, no changes in the EKG.  He is on a low-dose of a beta-blocker, continues to be bradycardic but asymptomatic.  No changes in the medication necessary at this point from cardiac standpoint. - EKG 12-Lead  2. Mixed hyperlipidemia Patient's lipids reviewed, LDL is >70.  I have added ezetimibe 10 mg daily, patient is presently on Lipitor 80 mg daily.  Will repeat lipid profile testing in about 4 to 6 weeks. - ezetimibe (ZETIA) 10 MG tablet; Take 1 tablet (10 mg total) by mouth daily.  Dispense: 90 tablet; Refill: 3 - Lipid Panel With LDL/HDL Ratio - Lipoprotein A (LPA)  3. Primary hypertension Discontinue losartan  and switch to olmesartan HCT 40/12.5 mg in the morning as his blood pressure is elevated.  Recheck BMP as well in about 4 weeks to 6 weeks prior to his next office visit in 2 months for follow-up of specifically hypertension and hyperlipidemia. - olmesartan-hydrochlorothiazide (BENICAR HCT) 40-12.5 MG tablet; Take 1 tablet by mouth every morning.  Dispense: 90 tablet; Refill:  3 - Basic metabolic panel  Addendum: Patient obtained lipid profile testing as a baseline again today hence we will recheck lipids in 6 weeks to 8 weeks as I am starting him on Zetia.    Yates Decamp, MD, [facility] [place] 09/06/2022, 8:35 AM Office: [phone] Fax: [phone] Pager: [phone]

## 2022-06-16 ENCOUNTER — Other Ambulatory Visit: Payer: Self-pay | Admitting: Cardiology

## 2022-06-16 DIAGNOSIS — I10 Essential (primary) hypertension: Secondary | ICD-10-CM

## 2022-07-11 ENCOUNTER — Ambulatory Visit: Payer: BC Managed Care – PPO | Admitting: Cardiology

## 2022-08-11 ENCOUNTER — Other Ambulatory Visit: Payer: Self-pay | Admitting: Cardiology

## 2022-08-11 DIAGNOSIS — I2510 Atherosclerotic heart disease of native coronary artery without angina pectoris: Secondary | ICD-10-CM

## 2022-08-17 ENCOUNTER — Ambulatory Visit: Payer: BC Managed Care – PPO | Admitting: Cardiology

## 2022-09-04 DIAGNOSIS — E782 Mixed hyperlipidemia: Secondary | ICD-10-CM | POA: Diagnosis not present

## 2022-09-04 DIAGNOSIS — I10 Essential (primary) hypertension: Secondary | ICD-10-CM | POA: Diagnosis not present

## 2022-09-05 ENCOUNTER — Ambulatory Visit: Payer: BC Managed Care – PPO | Admitting: Cardiology

## 2022-09-05 ENCOUNTER — Encounter: Payer: Self-pay | Admitting: Cardiology

## 2022-09-05 VITALS — BP 119/74 | HR 63 | Resp 16 | Ht 70.0 in | Wt 245.0 lb

## 2022-09-05 DIAGNOSIS — E782 Mixed hyperlipidemia: Secondary | ICD-10-CM | POA: Diagnosis not present

## 2022-09-05 DIAGNOSIS — I10 Essential (primary) hypertension: Secondary | ICD-10-CM | POA: Diagnosis not present

## 2022-09-05 DIAGNOSIS — I25118 Atherosclerotic heart disease of native coronary artery with other forms of angina pectoris: Secondary | ICD-10-CM | POA: Diagnosis not present

## 2022-09-05 NOTE — Progress Notes (Signed)
Primary Physician/Referring:  Darrow Bussing, MD  Patient ID: Adam Calderon, male    DOB: [birth year], 62 y.o.   MRN: [id number]  Chief Complaint  Patient presents with   Coronary artery disease involving native coronary artery of   Hypertension   Hyperlipidemia   Follow-up    2 Months   HPI:    Adam Calderon  is a 62 y.o. male  with coronary artery disease and angioplasty to his LAD and right coronary artery in September 2013, hyperlipidemia, primary hypertension, hyperglycemia and mild obesity and remote history of cocaine, tobacco use, crack and marijuana abuse presents here for 1 year follow-up.  He is asymptomatic.  He has remained abstinent from tobacco and also has been clean with regard to any drug use for many years now.   He was seen by me about 2 months ago and diet changes and Medications from losartan to Benicar HCT due to elevated blood pressure, also added Zetia to his medical therapy for hypercholesterolemia.  He is tolerating this well.  States that he has had 3-4 episodes of chest heaviness over a month ago during routine activities and at rest lasting for about 15 to 20 minutes up to 30 minutes at different times, he has not had any recurrence in the past 1 month.  Past Medical History:  Diagnosis Date   Acid reflux    Coronary artery disease    Unstable angina pectoris status post multiple stents 11/22/2011   Past Surgical History:  Procedure Laterality Date   ANEURYSM COILING     CORONARY ANGIOPLASTY WITH STENT PLACEMENT     HERNIA REPAIR     LEFT HEART CATHETERIZATION WITH CORONARY ANGIOGRAM N/A 11/22/2011   Procedure: LEFT HEART CATHETERIZATION WITH CORONARY ANGIOGRAM;  Surgeon: Pamella Pert, MD;  Location: [facility] CATH LAB;  Service: Cardiovascular;  Laterality: N/A;   Social History   Tobacco Use   Smoking status: Former    Current packs/day: 0.00    Average packs/day: 1.5 packs/day for 20.0 years (30.0 ttl pk-yrs)    Types: Cigarettes    Start date:  07/31/1985    Quit date: 07/31/2005    Years since quitting: 17.1   Smokeless tobacco: Never  Substance Use Topics   Alcohol use: Not Currently    Comment: recovering alcoholic    ROS  Review of Systems  Cardiovascular:  Positive for chest pain. Negative for dyspnea on exertion and leg swelling.  Gastrointestinal:  Negative for melena.   Objective  Blood pressure 119/74, pulse 63, resp. rate 16, height 5\' 10"  (1.778 m), weight 245 lb (111.1 kg), SpO2 98%.     09/05/2022    9:10 AM 05/11/2022    9:10 AM 05/10/2021    3:39 PM  Vitals with BMI  Height 5\' 10"  5\' 10"  5\' 10"   Weight 245 lbs 248 lbs 246 lbs 13 oz  BMI 35.15 35.58 35.41  Systolic 119 150 259  Diastolic 74 75 77  Pulse 63 58 57     Physical Exam Constitutional:      Comments: Mildly obese, muscular  Neck:     Thyroid: No thyromegaly.     Vascular: No JVD.  Cardiovascular:     Rate and Rhythm: Normal rate and regular rhythm.     Pulses: Intact distal pulses.     Heart sounds: Normal heart sounds. No murmur heard.    No gallop.  Pulmonary:     Effort: Pulmonary effort is normal.     Breath sounds:  Normal breath sounds.  Abdominal:     General: Bowel sounds are normal.     Palpations: Abdomen is soft.  Musculoskeletal:     Cervical back: Neck supple.     Right lower leg: No edema.     Left lower leg: No edema.    Laboratory examination:      Latest Ref Rng & Units 05/06/2020   10:02 AM 01/23/2020    8:42 PM 01/23/2020    8:35 PM  CMP  Glucose 65 - 99 mg/dL 161  096  045   BUN 6 - 24 mg/dL 12  14  12    Creatinine 0.76 - 1.27 mg/dL 4.09  8.11  9.14   Sodium 134 - 144 mmol/L 142  138  137   Potassium 3.5 - 5.2 mmol/L 4.9  3.1  3.1   Chloride 96 - 106 mmol/L 102  103  103   CO2 20 - 29 mmol/L 24   22   Calcium 8.7 - 10.2 mg/dL 9.4   8.2   Total Protein 6.5 - 8.1 g/dL   6.5   Total Bilirubin 0.3 - 1.2 mg/dL   1.0   Alkaline Phos 38 - 126 U/L   71   AST 15 - 41 U/L   36   ALT 0 - 44 U/L   50        Latest Ref Rng & Units 01/23/2020    8:42 PM 01/23/2020    8:35 PM 11/23/2011    2:15 AM  CBC  WBC 4.0 - 10.5 K/uL  20.3  7.9   Hemoglobin 13.0 - 17.0 g/dL 78.2  95.6  21.3   Hematocrit 39.0 - 52.0 % 42.0  43.4  42.5   Platelets 150 - 400 K/uL  288  235    Lab Results  Component Value Date   CHOL 154 05/06/2020   HDL 31 (L) 05/06/2020   LDLCALC 96 05/06/2020   TRIG 154 (H) 05/06/2020   CHOLHDL 6.8 11/22/2011    External Labs:  Cholesterol, total 161.000 m 02/09/2022 HDL 31.000 mg 02/09/2022 LDL 96.000 mg 02/09/2022 Triglycerides 195.000 m 02/09/2022  A1C 6.000 % 02/09/2022 TSH 1.330 02/09/2022  Hemoglobin 14.300 g/d 01/23/2020  Creatinine, Serum 1.050 mg/ 02/09/2022 CrCl Est 78.21 02/09/2022 eGFR 82.000 calc 02/09/2022 Potassium 4.900 mm 02/09/2022 ALT (SGPT) 40.000 U/L 02/09/2022  Radiology:  NA  Cardiac Studies:   Left Heart Catheterization 11/22/11:  PTCA and stenting of the Mid LAD with 3.5 x 23 mm Xience xpedition stent and proximal RCA with implantation of a 4.0 x 23 mm Xience epedition stent. Mid segment of circumflex shows a 50-60%  stenosis and a small OM-1 shows a 70% stenosis.  Echocardiogram 06/20/2017: Left ventricle cavity is normal in size. Mild to moderate concentric hypertrophy of the left ventricle. Normal global wall motion. Normal diastolic filling pattern. Calculated EF 55%. Left atrial cavity is moderately dilated at 4.7 cm. Right atrial cavity is mildly dilated. Mild (Grade I) mitral regurgitation. Mild tricuspid regurgitation. Mild pulmonary hypertension. IVC is dilated with respiratory variation. Suggests elevated central venous pressure. No significant change from  07/28/2015.  Exercise Treadmill Stress Test 05/21/2017:  Indication: Chest pain, SOB The patient exercised on Bruce protocol for  09:22 min. Patient achieved  10.76 METS and reached HR  146 bpm, which is   87% of maximum age-predicted HR.  Stress test terminated due to SOB.  Exercise capacity  was normal.  HR Response to Exercise: Appropriate. BP Response to Exercise: Normal  resting BP- appropriate response. Chest Pain: non-limiting. Arrhythmias: none.  ST Changes:  While interpretation is limited due to significat artifact, no overt ischemic changes seen at peak exercise.   Impression:  Normal exercise treadmill stress test. If clinical suspicion is high, consider nuclear sress test given known h/o CAD.  EKG:  EKG 05/11/2022: Normal sinus rhythm/sinus bradycardia at rate of 50 bpm, otherwise normal EKG.  No change from 05/10/2021.  Medications and allergies   Allergies  Allergen Reactions   Amoxapine And Related Itching and Rash   Amoxicillin Rash     Current Outpatient Medications:    aspirin EC 81 MG tablet, Take 1 tablet (81 mg total) by mouth daily., Disp: , Rfl:    atorvastatin (LIPITOR) 80 MG tablet, Take 1 tablet by mouth once daily, Disp: 90 tablet, Rfl: 0   cholecalciferol (VITAMIN D) 25 MCG (1000 UNIT) tablet, Take 1 tablet by mouth daily., Disp: , Rfl:    cyanocobalamin (VITAMIN B12) 1000 MCG tablet, Take 1,000 mcg by mouth daily., Disp: , Rfl:    ezetimibe (ZETIA) 10 MG tablet, Take 1 tablet (10 mg total) by mouth daily., Disp: 90 tablet, Rfl: 3   Fish Oil-Cholecalciferol (FISH OIL + D3 PO), Take 1 capsule by mouth daily., Disp: , Rfl:    metoprolol tartrate (LOPRESSOR) 25 MG tablet, Take 1/2 (one-half) tablet by mouth twice daily, Disp: 90 tablet, Rfl: 3   olmesartan-hydrochlorothiazide (BENICAR HCT) 40-12.5 MG tablet, Take 1 tablet by mouth every morning., Disp: 90 tablet, Rfl: 3   pantoprazole (PROTONIX) 40 MG tablet, TAKE 1 TABLET BY MOUTH ONCE DAILY AS NEEDED IF HEARTBURN IS NOT RELIEVED WITH PEPCID, Disp: 90 tablet, Rfl: 1   ZINC GLUCONATE PO, Take 25 mg by mouth daily., Disp: , Rfl:    Assessment     ICD-10-CM   1. Primary hypertension  I10     2. Coronary artery disease of native artery of native heart with stable angina pectoris (HCC)  I25.118      3. Mixed hyperlipidemia  E78.2        No orders of the defined types were placed in this encounter.   There are no discontinued medications.    Recommendations:   Adam Calderon  is a 62 y.o. male  with coronary artery disease and angioplasty to his LAD and right coronary artery in September 2013, hyperlipidemia, primary hypertension, hyperglycemia and mild obesity and remote history of cocaine, tobacco use, crack and marijuana abuse presents here for 1 year follow-up.    1. Coronary artery disease involving native coronary artery of native heart with stable angina pectoris Patient had 1 episode of chest tightness and chest heaviness about a month ago that lasted for about 4 to 5 days on and off lasting for about 30 minutes, symptoms suggest angina pectoris however he has not had any recurrence.  His last stress test was >5 years ago, if he has recurrence of chest pain, will certainly consider repeating a stress test.  Otherwise no change in his physical exam.  2. Mixed hyperlipidemia Patient's lipids reviewed, LDL is >70.  I have added ezetimibe 10 mg daily, patient is presently on Lipitor 80 mg daily.   Lp(a) and lipid profile testing is pended, he had his blood drawn yesterday and will follow-up on this.  3. Primary hypertension Since switching to olmesartan HCT, blood pressure in excellent control.  Will continue present medical management.  Unless lipids are not at goal, I will send a MyChart message regarding  his lipid status and I will see him back in a year.  Patient is aware to contact me if he has recurrence of chest discomfort suggestive of angina pectoris.  He is on appropriate medical therapy.    Yates Decamp, MD, [facility] 09/05/2022, 5:48 PM Office: [phone] Fax: [phone] Pager: [phone]

## 2022-09-06 NOTE — Addendum Note (Signed)
Addended by: Delrae Rend on: 09/06/2022 08:35 AM   Modules accepted: Orders

## 2022-09-16 DIAGNOSIS — J018 Other acute sinusitis: Secondary | ICD-10-CM | POA: Diagnosis not present

## 2022-09-19 ENCOUNTER — Other Ambulatory Visit: Payer: Self-pay | Admitting: Cardiology

## 2022-09-19 DIAGNOSIS — K219 Gastro-esophageal reflux disease without esophagitis: Secondary | ICD-10-CM

## 2022-09-29 DIAGNOSIS — R062 Wheezing: Secondary | ICD-10-CM | POA: Diagnosis not present

## 2022-09-29 DIAGNOSIS — J219 Acute bronchiolitis, unspecified: Secondary | ICD-10-CM | POA: Diagnosis not present

## 2022-10-02 ENCOUNTER — Other Ambulatory Visit: Payer: Self-pay | Admitting: Cardiology

## 2022-10-04 DIAGNOSIS — J45909 Unspecified asthma, uncomplicated: Secondary | ICD-10-CM | POA: Diagnosis not present

## 2022-10-04 DIAGNOSIS — R062 Wheezing: Secondary | ICD-10-CM | POA: Diagnosis not present

## 2022-10-19 DIAGNOSIS — Z4420 Encounter for fitting and adjustment of artificial eye, unspecified: Secondary | ICD-10-CM | POA: Diagnosis not present

## 2022-12-21 ENCOUNTER — Other Ambulatory Visit: Payer: Self-pay | Admitting: Cardiology

## 2022-12-21 DIAGNOSIS — K219 Gastro-esophageal reflux disease without esophagitis: Secondary | ICD-10-CM

## 2022-12-31 ENCOUNTER — Other Ambulatory Visit: Payer: Self-pay | Admitting: Cardiology

## 2023-02-15 DIAGNOSIS — Z125 Encounter for screening for malignant neoplasm of prostate: Secondary | ICD-10-CM | POA: Diagnosis not present

## 2023-02-15 DIAGNOSIS — I10 Essential (primary) hypertension: Secondary | ICD-10-CM | POA: Diagnosis not present

## 2023-02-15 DIAGNOSIS — E7800 Pure hypercholesterolemia, unspecified: Secondary | ICD-10-CM | POA: Diagnosis not present

## 2023-02-15 DIAGNOSIS — N401 Enlarged prostate with lower urinary tract symptoms: Secondary | ICD-10-CM | POA: Diagnosis not present

## 2023-02-15 DIAGNOSIS — Z0000 Encounter for general adult medical examination without abnormal findings: Secondary | ICD-10-CM | POA: Diagnosis not present

## 2023-02-15 DIAGNOSIS — R7303 Prediabetes: Secondary | ICD-10-CM | POA: Diagnosis not present

## 2023-02-26 ENCOUNTER — Other Ambulatory Visit: Payer: Self-pay | Admitting: Cardiology

## 2023-02-26 DIAGNOSIS — I10 Essential (primary) hypertension: Secondary | ICD-10-CM

## 2023-02-27 ENCOUNTER — Other Ambulatory Visit: Payer: Self-pay | Admitting: Cardiology

## 2023-02-27 DIAGNOSIS — I10 Essential (primary) hypertension: Secondary | ICD-10-CM

## 2023-03-08 ENCOUNTER — Telehealth: Payer: Self-pay | Admitting: Cardiology

## 2023-03-08 NOTE — Telephone Encounter (Signed)
Pt reports he was taking both Losartan 25 mg and Benicar 40-12.5 up until about 4 days ago. BP WNL Pt was not aware/understanding to stop Losartan back in April 2024. Pt educated to rationale as to why he would not take both. Aware forwarding to MD for further advisement.

## 2023-03-08 NOTE — Telephone Encounter (Signed)
Pt c/o medication issue:  1. Name of Medication: losartan (COZAAR) 50 MG tablet   2. How are you currently taking this medication (dosage and times per day)?   3. Are you having a reaction (difficulty breathing--STAT)? No  4. What is your medication issue? Pt is requesting a callback regarding him wanting to start this medication again. Please advise

## 2023-03-23 ENCOUNTER — Telehealth: Payer: Self-pay | Admitting: Cardiology

## 2023-03-23 DIAGNOSIS — R0609 Other forms of dyspnea: Secondary | ICD-10-CM

## 2023-03-23 DIAGNOSIS — I25118 Atherosclerotic heart disease of native coronary artery with other forms of angina pectoris: Secondary | ICD-10-CM

## 2023-03-23 NOTE — Telephone Encounter (Signed)
   Pt c/o of Chest Pain: STAT if active CP, including tightness, pressure, jaw pain, radiating pain to shoulder/upper arm/back, CP unrelieved by Nitro. Symptoms reported of SOB, nausea, vomiting, sweating.  1. Are you having CP right now? No; last night    2. Are you experiencing any other symptoms (ex. SOB, nausea, vomiting, sweating)? SOB   3. Is your CP continuous or coming and going? Coming and going   4. Have you taken Nitroglycerin? No    5. How long have you been experiencing CP? Last night    6. If NO CP at time of call then end call with telling Pt to call back or call 911 if Chest pain returns prior to return call from triage team.

## 2023-03-23 NOTE — Telephone Encounter (Signed)
Pt stated they had been having CP for the last couple of weeks but hasn't had any since last night. Pt states he has been SOB for some time and it isn't a new occurrence. He stated he had taken a dose of Losartan last night that was his wife's. Advised pt to not take anymore of his wife's medications and to wait for Dr. Verl Dicker recommendations before changing medications. Advised pt that if he doesn't hear back from anyone over the weekend to get evaluated at an urgent care or ED if the CP comes back. Pt verbalized understanding and had no further questions at this time.

## 2023-03-25 NOTE — Telephone Encounter (Signed)
He was on Olmesartan. If he is out of them, please renew his meds. He has known CAD and his symptoms could represent angina. I would like him to have a nuclears exercise stress and see me in the office. CAD with angina

## 2023-03-27 NOTE — Addendum Note (Signed)
Addended by: Dossie Arbour on: 03/27/2023 12:29 PM   Modules accepted: Orders

## 2023-03-27 NOTE — Addendum Note (Signed)
Addended by: Delrae Rend on: 03/27/2023 05:21 PM   Modules accepted: Orders

## 2023-03-27 NOTE — Telephone Encounter (Signed)
ICD-10-CM   1. Coronary artery disease of native artery of native heart with stable angina pectoris [facility])  I25.118 Cardiac Stress Test: Informed Consent Details: Physician/Practitioner Attestation; Transcribe to consent form and obtain patient signature    2. DOE (dyspnea on exertion)  R06.09 MYOCARDIAL PERFUSION IMAGING    Cardiac Stress Test: Informed Consent Details: Physician/Practitioner Attestation; Transcribe to consent form and obtain patient signature

## 2023-03-27 NOTE — Telephone Encounter (Signed)
I spoke with patient.  He had been taking both Olmesartan hydrochlorothiazide and losartan until late January when he called office.  (See phone note from January 30).  At that point he stopped losartan and started taking Olmesartan hydrochlorothiazide. He has Olmesartan hydrochlorothiazide at home but does not have any more losartan.  Patient reports his BP was elevated with systolic of 189 when he went to the dentist recently.  Upon recheck it came down to 160. He checked BP also at home one time recently and reports it was also elevated.  Patient currently has the flu.   I asked him to resume Olmesartan hydrochlorothiazide at this time since he had this at home and check BP at home.  Patient does not feel Olmesartan hydrochlorothiazide works for him and wants to go back to Losartan.   Patient made aware Dr Jacinto Halim would like him to have nuclear stress test and then office visit.  Patient feels he can walk on the treadmill.  Exercise myoview ordered.  Appointment made for patient to see Dr Jacinto Halim on March 21.

## 2023-03-28 ENCOUNTER — Encounter (HOSPITAL_COMMUNITY): Payer: Self-pay | Admitting: Cardiology

## 2023-03-30 ENCOUNTER — Encounter (HOSPITAL_COMMUNITY): Payer: Self-pay

## 2023-04-12 ENCOUNTER — Ambulatory Visit (HOSPITAL_COMMUNITY): Payer: BC Managed Care – PPO | Attending: Cardiology

## 2023-04-12 DIAGNOSIS — R0609 Other forms of dyspnea: Secondary | ICD-10-CM

## 2023-04-12 MED ORDER — TECHNETIUM TC 99M TETROFOSMIN IV KIT
10.1000 | PACK | Freq: Once | INTRAVENOUS | Status: AC | PRN
  Administered 2023-04-12: 10.1 via INTRAVENOUS

## 2023-04-12 MED ORDER — TECHNETIUM TC 99M TETROFOSMIN IV KIT: 32.3000 | PACK | Freq: Once | INTRAVENOUS | Status: DC | PRN

## 2023-04-13 ENCOUNTER — Ambulatory Visit (HOSPITAL_COMMUNITY): Attending: Cardiology

## 2023-04-13 DIAGNOSIS — R0609 Other forms of dyspnea: Secondary | ICD-10-CM | POA: Insufficient documentation

## 2023-04-13 LAB — MYOCARDIAL PERFUSION IMAGING
LV dias vol: 111 mL (ref 62–150)
LV sys vol: 45 mL
Nuc Stress EF: 60 %
Peak HR: 114 {beats}/min
Rest HR: 55 {beats}/min
Rest Nuclear Isotope Dose: 10.1 mCi
SDS: 1
SRS: 0
SSS: 1
ST Depression (mm): 0 mm
Stress Nuclear Isotope Dose: 30.7 mCi
TID: 1.01

## 2023-04-13 MED ORDER — TECHNETIUM TC 99M TETROFOSMIN IV KIT
30.7000 | PACK | Freq: Once | INTRAVENOUS | Status: AC | PRN
  Administered 2023-04-13: 30.7 via INTRAVENOUS

## 2023-04-13 MED ORDER — REGADENOSON 0.4 MG/5ML IV SOLN
0.4000 mg | Freq: Once | INTRAVENOUS | Status: AC
Start: 2023-04-13 — End: 2023-04-13
  Administered 2023-04-13: 0.4 mg via INTRAVENOUS

## 2023-04-15 ENCOUNTER — Encounter: Payer: Self-pay | Admitting: Cardiology

## 2023-04-15 NOTE — Progress Notes (Signed)
Normal stress test and normal heart function

## 2023-04-19 ENCOUNTER — Encounter: Payer: Self-pay | Admitting: Cardiology

## 2023-04-19 ENCOUNTER — Ambulatory Visit: Payer: BC Managed Care – PPO | Attending: Cardiology | Admitting: Cardiology

## 2023-04-19 VITALS — BP 134/72 | HR 53 | Resp 16 | Ht 70.0 in | Wt 239.8 lb

## 2023-04-19 DIAGNOSIS — I10 Essential (primary) hypertension: Secondary | ICD-10-CM

## 2023-04-19 DIAGNOSIS — I25118 Atherosclerotic heart disease of native coronary artery with other forms of angina pectoris: Secondary | ICD-10-CM | POA: Diagnosis not present

## 2023-04-19 DIAGNOSIS — E782 Mixed hyperlipidemia: Secondary | ICD-10-CM

## 2023-04-19 DIAGNOSIS — R0609 Other forms of dyspnea: Secondary | ICD-10-CM | POA: Diagnosis not present

## 2023-04-19 MED ORDER — LOSARTAN POTASSIUM-HCTZ 100-12.5 MG PO TABS: 1.0000 | ORAL_TABLET | ORAL | 3 refills | Status: AC

## 2023-04-19 NOTE — Progress Notes (Signed)
 Cardiology Office Note:  .   Date:  04/19/2023  ID:  Adam Calderon, DOB 01-Oct-1960, MRN [id number] PCP: Darrow Bussing, MD  [facility] Providers Cardiologist:  Yates Decamp, MD   History of Present Illness: .   Adam Calderon is a 63 y.o. male with coronary artery disease and angioplasty to his LAD and right coronary artery in September 2013, hyperlipidemia, primary hypertension, hyperglycemia and mild obesity and remote history of cocaine, tobacco use, crack and marijuana abuse presents here for 1 year follow-up. He is asymptomatic. H  e has remained abstinent from tobacco and also has been clean with regard to any drug use for many years now.  Due to episodes of chest discomfort, he underwent nuclear stress testing on 04/13/2023 which was nonischemic.  He now presents for a 59-month office visit.  Discussed the use of AI scribe software for clinical note transcription with the patient, who gave verbal consent to proceed.  History of Present Illness   The patient, with a history of hypertension and hypercholesterolemia, presents with chest pain that has been ongoing for approximately three weeks. The pain was described as a pressure sensation, which was exacerbated when the patient stopped taking losartan. The patient reported feeling winded and experiencing chest pain during a recent exercise stress test, which he was unable to complete due to the discomfort. The patient has been self-managing his hypertension with losartan obtained without a prescription, after being switched to omisartan by his healthcare provider. He reported a preference for losartan, stating that he felt better while taking it. The patient also reported feeling tired, which he attributed to his weight and age. He has recently started a new exercise regimen, walking four miles a day, and is attempting to lose weight. The patient also reported some pain in his lower back when driving, which he attributed to sitting on his  wallet.      Labs   Lab Results  Component Value Date   CHOL 150 09/04/2022   HDL 34 (L) 09/04/2022   LDLCALC 90 09/04/2022   TRIG 146 09/04/2022   CHOLHDL 6.8 11/22/2011   Lab Results  Component Value Date   NA 141 09/04/2022   K 4.9 09/04/2022   CO2 26 09/04/2022   GLUCOSE 96 09/04/2022   BUN 10 09/04/2022   CREATININE 0.97 09/04/2022   CALCIUM 9.5 09/04/2022   EGFR 90 09/04/2022   GFRNONAA >60 01/23/2020      Latest Ref Rng & Units 09/04/2022    2:21 PM 05/06/2020   10:02 AM 01/23/2020    8:42 PM  BMP  Glucose 70 - 99 mg/dL 96  409  811   BUN 6 - 24 mg/dL 10  12  14    Creatinine 0.76 - 1.27 mg/dL 9.14  7.82  9.56   BUN/Creat Ratio 9 - 20 10  14     Sodium 134 - 144 mmol/L 141  142  138   Potassium 3.5 - 5.2 mmol/L 4.9  4.9  3.1   Chloride 96 - 106 mmol/L 98  102  103   CO2 20 - 29 mmol/L 26  24    Calcium 8.7 - 10.2 mg/dL 9.5  9.4        Latest Ref Rng & Units 01/23/2020    8:42 PM 01/23/2020    8:35 PM 11/23/2011    2:15 AM  CBC  WBC 4.0 - 10.5 K/uL  20.3  7.9   Hemoglobin 13.0 - 17.0 g/dL 21.3  14.5  15.2   Hematocrit 39.0 - 52.0 % 42.0  43.4  42.5   Platelets 150 - 400 K/uL  288  235    Lab Results  Component Value Date   HGBA1C 5.7 (H) 11/23/2011    Lab Results  Component Value Date   TSH 1.042 11/23/2011   Review of Systems  Cardiovascular:  Positive for dyspnea on exertion. Negative for chest pain and leg swelling.   Physical Exam:   VS:  BP 134/72 (BP Location: Left Arm, Patient Position: Sitting, Cuff Size: Normal)   Pulse (!) 53   Resp 16   Ht 5\' 10"  (1.778 m)   Wt 239 lb 12.8 oz (108.8 kg)   SpO2 97%   BMI 34.41 kg/m    Wt Readings from Last 3 Encounters:  04/19/23 239 lb 12.8 oz (108.8 kg)  09/05/22 245 lb (111.1 kg)  05/11/22 248 lb (112.5 kg)    Physical Exam Neck:     Vascular: No carotid bruit or JVD.  Cardiovascular:     Rate and Rhythm: Normal rate and regular rhythm.     Pulses: Intact distal pulses.     Heart sounds:  Normal heart sounds. No murmur heard.    No gallop.  Pulmonary:     Effort: Pulmonary effort is normal.     Breath sounds: Normal breath sounds.  Abdominal:     General: Bowel sounds are normal.     Palpations: Abdomen is soft.  Musculoskeletal:     Right lower leg: No edema.     Left lower leg: No edema.    Studies Reviewed: .    MYOCARDIAL PERFUSION IMAGING 04/13/2023   Patient performed Bruce protocol exercise stress test however could not achieve target heart rate, did exercise for close to 7 minutes and had hypertensive blood pressure response with peak blood pressure of 209/79 mmHg.    The study is normal. The study is low risk.   No ST deviation was noted.   LV perfusion is normal. There is no evidence of ischemia. There is no evidence of infarction.   Left ventricular function is normal. Nuclear stress EF: 60%. The left ventricular ejection fraction is normal (55-65%). End diastolic cavity size is normal. End systolic cavity size is normal.   Prior study available for comparison from 08/03/2015.   EKG:    EKG Interpretation Date/Time:  Thursday April 19 2023 13:35:24 EDT Ventricular Rate:  52 PR Interval:  144 QRS Duration:  84 QT Interval:  420 QTC Calculation: 390 R Axis:   41  Text Interpretation: EKG 04/19/2023: Normal sinus rhythm with rate of 52 bpm, normal EKG. Confirmed by Delrae Rend [phone]) on 04/19/2023 2:02:00 PM    EKG 05/11/2022: Normal sinus rhythm/sinus bradycardia at rate of 50 bpm, otherwise normal EKG.   Medications and allergies    Allergies  Allergen Reactions   Amoxapine And Related Itching and Rash   Amoxicillin Rash    Current Outpatient Medications:    aspirin EC 81 MG tablet, Take 1 tablet (81 mg total) by mouth daily., Disp: , Rfl:    atorvastatin (LIPITOR) 80 MG tablet, Take 1 tablet by mouth once daily, Disp: 90 tablet, Rfl: 2   cholecalciferol (VITAMIN D) 25 MCG (1000 UNIT) tablet, Take 1 tablet by mouth daily., Disp: , Rfl:     cyanocobalamin (VITAMIN B12) 1000 MCG tablet, Take 1,000 mcg by mouth daily., Disp: , Rfl:    ezetimibe (ZETIA) 10 MG tablet, Take 1 tablet (10 mg total)  by mouth daily., Disp: 90 tablet, Rfl: 3   Fish Oil-Cholecalciferol (FISH OIL + D3 PO), Take 1 capsule by mouth daily., Disp: , Rfl:    losartan-hydrochlorothiazide (HYZAAR) 100-12.5 MG tablet, Take 1 tablet by mouth every morning., Disp: 90 tablet, Rfl: 3   metoprolol tartrate (LOPRESSOR) 25 MG tablet, Take 1/2 (one-half) tablet by mouth twice daily, Disp: 90 tablet, Rfl: 3   pantoprazole (PROTONIX) 40 MG tablet, TAKE 1 TABLET BY MOUTH ONCE DAILY AS NEEDED IF HEARTBURN IS NOT RELIEVED WITH PEPCID, Disp: 90 tablet, Rfl: 2   tamsulosin (FLOMAX) 0.4 MG CAPS capsule, Take 0.4 mg by mouth daily., Disp: , Rfl:    ZINC GLUCONATE PO, Take 25 mg by mouth daily., Disp: , Rfl:    ASSESSMENT AND PLAN: .      ICD-10-CM   1. Coronary artery disease of native artery of native heart with stable angina pectoris (HCC)  I25.118 EKG 12-Lead    Lipid Profile    2. DOE (dyspnea on exertion)  R06.09 Lipid Profile    3. Primary hypertension  I10 losartan-hydrochlorothiazide (HYZAAR) 100-12.5 MG tablet    Lipid Profile    4. Mixed hyperlipidemia  E78.2 Lipid Profile      Assessment and Plan    Chest Pain/Dyspnea on exertion  Intermittent chest pain is likely related to hypertension. A stress test showed no significant coronary artery disease, though he experienced chest pain and could not reach the target heart rate due to a hypertensive response. The chest pain is not considered worrisome for coronary artery disease given the negative stress test results. Increase losartan to 100 mg daily with 12.5 mg hydrochlorothiazide to manage hypertension and reduce fluid retention. Advise use of nitroglycerin for chest pain as needed. Patient previously was started on olmesartan HCT however patient states that he never filled this up.  States that he got losartan "on the  street" and he likes losartan better as it controls his blood pressure.  Hypertension   Blood pressure was elevated at 189/140 mmHg during a recent dental visit. He was previously on losartan but switched to olmesartan. Prefers losartan and reports better control with it. Switch back to losartan 100 mg daily with 12.5 mg hydrochlorothiazide based on preference and previous effective control. Encourage use of MyChart for medication management and communication.  Hypercholesterolemia   He is on atorvastatin 80 mg daily and ezetimibe. Previous labs showed elevated LDL-cholesterol levels. Order blood work to check current cholesterol levels to ensure continued control.  Obstructive Sleep Apnea (OSA)   Reports fatigue and tiredness, possibly related to undiagnosed sleep apnea. No formal sleep study has been conducted. A sleep study is recommended to evaluate for OSA as a potential cause of fatigue. Refer to primary care physician for a sleep study evaluation.          Signed,  Yates Decamp, MD, [facility] 04/19/2023, 9:43 PM [facility] [address] [place], [place] [address] Phone: [phone]. Fax:  [phone]

## 2023-04-19 NOTE — Patient Instructions (Signed)
 Medication Instructions:  Discontinue losartan Start losartan hydrochlorothiazide  100/12.5 mg by mouth daily   *If you need a refill on your cardiac medications before your next appointment, please call your pharmacy*   Lab Work: Have lab work done today at [facility]--Lipid profile If you have labs (blood work) drawn today and your tests are completely normal, you will receive your results only by: MyChart Message (if you have MyChart) OR A paper copy in the mail If you have any lab test that is abnormal or we need to change your treatment, we will call you to review the results.   Testing/Procedures: none   Follow-Up: At [facility], you and your health needs are our priority.  As part of our continuing mission to provide you with exceptional heart care, we have created designated Provider Care Teams.  These Care Teams include your primary Cardiologist (physician) and Advanced Practice Providers (APPs -  Physician Assistants and Nurse Practitioners) who all work together to provide you with the care you need, when you need it.  We recommend signing up for the patient portal called "MyChart".  Sign up information is provided on this After Visit Summary.  MyChart is used to connect with patients for Virtual Visits (Telemedicine).  Patients are able to view lab/test results, encounter notes, upcoming appointments, etc.  Non-urgent messages can be sent to your provider as well.   To learn more about what you can do with MyChart, go to [email or web address].    Your next appointment:   12 month(s)  Provider:   Yates Decamp, MD     Other Instructions

## 2023-04-20 ENCOUNTER — Encounter: Payer: Self-pay | Admitting: Cardiology

## 2023-04-20 LAB — LIPID PANEL
Chol/HDL Ratio: 4.2 ratio (ref 0.0–5.0)
Cholesterol, Total: 123 mg/dL (ref 100–199)
HDL: 29 mg/dL — ABNORMAL LOW (ref >39)
LDL Chol Calc (NIH): 70 mg/dL (ref 0–99)
Triglycerides: 131 mg/dL (ref 0–149)
VLDL Cholesterol Cal: 24 mg/dL (ref 5–40)

## 2023-04-20 NOTE — Progress Notes (Signed)
 Your cholesterol is well controlled on the medications you are on. No change needed

## 2023-04-27 ENCOUNTER — Ambulatory Visit: Payer: BC Managed Care – PPO | Admitting: Cardiology

## 2023-05-10 ENCOUNTER — Other Ambulatory Visit: Payer: Self-pay | Admitting: Cardiology

## 2023-05-10 DIAGNOSIS — I10 Essential (primary) hypertension: Secondary | ICD-10-CM

## 2023-05-10 DIAGNOSIS — E782 Mixed hyperlipidemia: Secondary | ICD-10-CM

## 2023-05-31 DIAGNOSIS — I10 Essential (primary) hypertension: Secondary | ICD-10-CM | POA: Diagnosis not present

## 2023-05-31 DIAGNOSIS — N401 Enlarged prostate with lower urinary tract symptoms: Secondary | ICD-10-CM | POA: Diagnosis not present

## 2023-08-06 ENCOUNTER — Other Ambulatory Visit: Payer: Self-pay | Admitting: Cardiology

## 2023-08-06 DIAGNOSIS — I2510 Atherosclerotic heart disease of native coronary artery without angina pectoris: Secondary | ICD-10-CM

## 2023-09-05 ENCOUNTER — Ambulatory Visit: Payer: BC Managed Care – PPO | Admitting: Cardiology

## 2023-09-30 ENCOUNTER — Other Ambulatory Visit: Payer: Self-pay | Admitting: Cardiology

## 2023-09-30 DIAGNOSIS — K219 Gastro-esophageal reflux disease without esophagitis: Secondary | ICD-10-CM

## 2023-10-02 DIAGNOSIS — I10 Essential (primary) hypertension: Secondary | ICD-10-CM | POA: Diagnosis not present

## 2023-10-02 DIAGNOSIS — R7303 Prediabetes: Secondary | ICD-10-CM | POA: Diagnosis not present

## 2023-10-02 DIAGNOSIS — N529 Male erectile dysfunction, unspecified: Secondary | ICD-10-CM | POA: Diagnosis not present

## 2023-10-02 DIAGNOSIS — N401 Enlarged prostate with lower urinary tract symptoms: Secondary | ICD-10-CM | POA: Diagnosis not present

## 2023-10-15 DIAGNOSIS — Z4420 Encounter for fitting and adjustment of artificial eye, unspecified: Secondary | ICD-10-CM | POA: Diagnosis not present

## 2023-10-19 DIAGNOSIS — J4530 Mild persistent asthma, uncomplicated: Secondary | ICD-10-CM | POA: Diagnosis not present

## 2023-10-24 DIAGNOSIS — M5451 Vertebrogenic low back pain: Secondary | ICD-10-CM | POA: Diagnosis not present

## 2023-10-31 DIAGNOSIS — E291 Testicular hypofunction: Secondary | ICD-10-CM | POA: Diagnosis not present

## 2023-10-31 DIAGNOSIS — R7989 Other specified abnormal findings of blood chemistry: Secondary | ICD-10-CM | POA: Diagnosis not present

## 2023-11-26 DIAGNOSIS — J45909 Unspecified asthma, uncomplicated: Secondary | ICD-10-CM | POA: Diagnosis not present

## 2023-11-26 DIAGNOSIS — H1045 Other chronic allergic conjunctivitis: Secondary | ICD-10-CM | POA: Diagnosis not present

## 2023-11-26 DIAGNOSIS — J45998 Other asthma: Secondary | ICD-10-CM | POA: Diagnosis not present

## 2023-11-26 DIAGNOSIS — J4530 Mild persistent asthma, uncomplicated: Secondary | ICD-10-CM | POA: Diagnosis not present

## 2023-11-26 DIAGNOSIS — J300 Vasomotor rhinitis: Secondary | ICD-10-CM | POA: Diagnosis not present

## 2023-11-26 DIAGNOSIS — R052 Subacute cough: Secondary | ICD-10-CM | POA: Diagnosis not present

## 2023-12-05 DIAGNOSIS — Z789 Other specified health status: Secondary | ICD-10-CM | POA: Diagnosis not present

## 2023-12-05 DIAGNOSIS — L821 Other seborrheic keratosis: Secondary | ICD-10-CM | POA: Diagnosis not present

## 2023-12-05 DIAGNOSIS — D485 Neoplasm of uncertain behavior of skin: Secondary | ICD-10-CM | POA: Diagnosis not present

## 2023-12-05 DIAGNOSIS — L2989 Other pruritus: Secondary | ICD-10-CM | POA: Diagnosis not present

## 2023-12-05 DIAGNOSIS — D1801 Hemangioma of skin and subcutaneous tissue: Secondary | ICD-10-CM | POA: Diagnosis not present

## 2023-12-05 DIAGNOSIS — L820 Inflamed seborrheic keratosis: Secondary | ICD-10-CM | POA: Diagnosis not present

## 2023-12-05 DIAGNOSIS — L814 Other melanin hyperpigmentation: Secondary | ICD-10-CM | POA: Diagnosis not present

## 2023-12-05 DIAGNOSIS — L538 Other specified erythematous conditions: Secondary | ICD-10-CM | POA: Diagnosis not present

## 2023-12-19 DIAGNOSIS — Z86018 Personal history of other benign neoplasm: Secondary | ICD-10-CM | POA: Diagnosis not present

## 2023-12-19 DIAGNOSIS — I2510 Atherosclerotic heart disease of native coronary artery without angina pectoris: Secondary | ICD-10-CM | POA: Diagnosis not present

## 2023-12-21 DIAGNOSIS — C44529 Squamous cell carcinoma of skin of other part of trunk: Secondary | ICD-10-CM | POA: Diagnosis not present

## 2024-01-01 DIAGNOSIS — D123 Benign neoplasm of transverse colon: Secondary | ICD-10-CM | POA: Diagnosis not present

## 2024-01-01 DIAGNOSIS — Z09 Encounter for follow-up examination after completed treatment for conditions other than malignant neoplasm: Secondary | ICD-10-CM | POA: Diagnosis not present

## 2024-01-01 DIAGNOSIS — Z860101 Personal history of adenomatous and serrated colon polyps: Secondary | ICD-10-CM | POA: Diagnosis not present

## 2024-01-09 DIAGNOSIS — R7303 Prediabetes: Secondary | ICD-10-CM | POA: Diagnosis not present

## 2024-01-16 DIAGNOSIS — J4520 Mild intermittent asthma, uncomplicated: Secondary | ICD-10-CM | POA: Diagnosis not present

## 2024-01-16 DIAGNOSIS — Z9189 Other specified personal risk factors, not elsewhere classified: Secondary | ICD-10-CM | POA: Diagnosis not present

## 2024-01-16 DIAGNOSIS — Z87891 Personal history of nicotine dependence: Secondary | ICD-10-CM | POA: Diagnosis not present

## 2024-01-16 DIAGNOSIS — R0609 Other forms of dyspnea: Secondary | ICD-10-CM | POA: Diagnosis not present
